# Patient Record
Sex: Female | Born: 1950 | Race: Black or African American | Hispanic: No | Marital: Single | Smoking: Never smoker
Health system: Southern US, Community
[De-identification: ages and names within clinical notes are randomized; demographics above are authoritative.]

## PROBLEM LIST (undated history)

## (undated) ENCOUNTER — Emergency Department (HOSPITAL_COMMUNITY): Payer: Medicare Other

## (undated) DIAGNOSIS — N200 Calculus of kidney: Secondary | ICD-10-CM

## (undated) DIAGNOSIS — M810 Age-related osteoporosis without current pathological fracture: Secondary | ICD-10-CM

## (undated) DIAGNOSIS — R928 Other abnormal and inconclusive findings on diagnostic imaging of breast: Secondary | ICD-10-CM

## (undated) DIAGNOSIS — K635 Polyp of colon: Secondary | ICD-10-CM

## (undated) DIAGNOSIS — G43909 Migraine, unspecified, not intractable, without status migrainosus: Secondary | ICD-10-CM

## (undated) DIAGNOSIS — M8580 Other specified disorders of bone density and structure, unspecified site: Secondary | ICD-10-CM

## (undated) DIAGNOSIS — R42 Dizziness and giddiness: Secondary | ICD-10-CM

## (undated) HISTORY — DX: Age-related osteoporosis without current pathological fracture: M81.0

## (undated) HISTORY — DX: Other specified disorders of bone density and structure, unspecified site: M85.80

## (undated) HISTORY — DX: Polyp of colon: K63.5

## (undated) HISTORY — DX: Migraine, unspecified, not intractable, without status migrainosus: G43.909

## (undated) HISTORY — DX: Other abnormal and inconclusive findings on diagnostic imaging of breast: R92.8

## (undated) HISTORY — DX: Dizziness and giddiness: R42

## (undated) HISTORY — DX: Calculus of kidney: N20.0

## (undated) HISTORY — PX: ABDOMINAL HYSTERECTOMY: SHX81

---

## 1999-03-31 ENCOUNTER — Ambulatory Visit (HOSPITAL_COMMUNITY): Admission: RE | Admit: 1999-03-31 | Discharge: 1999-03-31 | Payer: Self-pay | Admitting: Gastroenterology

## 2000-06-08 ENCOUNTER — Encounter: Payer: Self-pay | Admitting: Obstetrics and Gynecology

## 2000-06-08 ENCOUNTER — Encounter: Admission: RE | Admit: 2000-06-08 | Discharge: 2000-06-08 | Payer: Self-pay | Admitting: Obstetrics and Gynecology

## 2001-07-23 ENCOUNTER — Encounter: Admission: RE | Admit: 2001-07-23 | Discharge: 2001-07-23 | Payer: Self-pay | Admitting: Obstetrics and Gynecology

## 2001-07-23 ENCOUNTER — Encounter: Payer: Self-pay | Admitting: Obstetrics and Gynecology

## 2001-08-06 ENCOUNTER — Encounter: Admission: RE | Admit: 2001-08-06 | Discharge: 2001-08-06 | Payer: Self-pay | Admitting: Obstetrics and Gynecology

## 2001-08-06 ENCOUNTER — Encounter: Payer: Self-pay | Admitting: Obstetrics and Gynecology

## 2002-04-08 ENCOUNTER — Other Ambulatory Visit: Admission: RE | Admit: 2002-04-08 | Discharge: 2002-04-08 | Payer: Self-pay | Admitting: *Deleted

## 2002-08-13 ENCOUNTER — Encounter: Admission: RE | Admit: 2002-08-13 | Discharge: 2002-08-13 | Payer: Self-pay | Admitting: *Deleted

## 2002-08-13 ENCOUNTER — Encounter: Payer: Self-pay | Admitting: *Deleted

## 2004-11-08 ENCOUNTER — Emergency Department (HOSPITAL_COMMUNITY): Admission: EM | Admit: 2004-11-08 | Discharge: 2004-11-08 | Payer: Self-pay | Admitting: Emergency Medicine

## 2005-02-01 ENCOUNTER — Encounter: Admission: RE | Admit: 2005-02-01 | Discharge: 2005-02-01 | Payer: Self-pay | Admitting: *Deleted

## 2005-02-10 ENCOUNTER — Encounter: Admission: RE | Admit: 2005-02-10 | Discharge: 2005-02-10 | Payer: Self-pay | Admitting: *Deleted

## 2008-03-12 ENCOUNTER — Emergency Department (HOSPITAL_COMMUNITY): Admission: EM | Admit: 2008-03-12 | Discharge: 2008-03-12 | Payer: Self-pay | Admitting: Emergency Medicine

## 2011-03-23 ENCOUNTER — Other Ambulatory Visit: Payer: Self-pay | Admitting: Obstetrics and Gynecology

## 2011-03-23 DIAGNOSIS — R928 Other abnormal and inconclusive findings on diagnostic imaging of breast: Secondary | ICD-10-CM

## 2011-04-26 ENCOUNTER — Ambulatory Visit
Admission: RE | Admit: 2011-04-26 | Discharge: 2011-04-26 | Disposition: A | Discharge status: Home / Self Care | Payer: BC Managed Care – PPO | Source: Ambulatory Visit | Attending: Obstetrics and Gynecology | Admitting: Obstetrics and Gynecology

## 2011-04-26 ENCOUNTER — Other Ambulatory Visit: Payer: Self-pay | Admitting: Obstetrics and Gynecology

## 2011-04-26 DIAGNOSIS — R928 Other abnormal and inconclusive findings on diagnostic imaging of breast: Secondary | ICD-10-CM

## 2012-06-07 ENCOUNTER — Other Ambulatory Visit: Payer: Self-pay | Admitting: Obstetrics and Gynecology

## 2012-06-07 DIAGNOSIS — Z1231 Encounter for screening mammogram for malignant neoplasm of breast: Secondary | ICD-10-CM

## 2012-06-18 ENCOUNTER — Ambulatory Visit
Admission: RE | Admit: 2012-06-18 | Discharge: 2012-06-18 | Disposition: A | Discharge status: Home / Self Care | Payer: BC Managed Care – PPO | Source: Ambulatory Visit | Attending: Obstetrics and Gynecology | Admitting: Obstetrics and Gynecology

## 2012-06-18 DIAGNOSIS — Z1231 Encounter for screening mammogram for malignant neoplasm of breast: Secondary | ICD-10-CM

## 2012-07-02 ENCOUNTER — Encounter: Payer: Self-pay | Admitting: Obstetrics and Gynecology

## 2012-07-24 ENCOUNTER — Encounter: Payer: Self-pay | Admitting: Obstetrics and Gynecology

## 2012-07-24 ENCOUNTER — Ambulatory Visit (INDEPENDENT_AMBULATORY_CARE_PROVIDER_SITE_OTHER): Payer: BC Managed Care – PPO | Admitting: Obstetrics and Gynecology

## 2012-07-24 VITALS — BP 118/80 | Ht 65.0 in | Wt 188.0 lb

## 2012-07-24 DIAGNOSIS — M810 Age-related osteoporosis without current pathological fracture: Secondary | ICD-10-CM

## 2012-07-24 DIAGNOSIS — Z124 Encounter for screening for malignant neoplasm of cervix: Secondary | ICD-10-CM

## 2012-07-24 NOTE — Progress Notes (Signed)
Last Pap: 5/12 WNL: Yes Regular Periods:no Contraception: n/a  Monthly Breast exam:yes Tetanus<56yrs:yes Nl.Bladder Function:yes Daily BMs:yes Healthy Diet:yes Calcium:yes Mammogram:yes Date of Mammogram: 04/2012 Exercise:occ Have often Exercise: occ Seatbelt: yes Abuse at home: no Stressful work:no Sigmoid-colonoscopy: schd for 08/02/12 Bone Density: Yes 03/24/11 PCP: n/a Change in PMH: no chnage Change in FMH:no change BP 118/80  Ht 5\' 5"  (1.651 m)  Wt 188 lb (85.276 kg)  BMI 31.28 kg/m2 Physical Examination: Neck - supple, no significant adenopathy Chest - clear to auscultation, no wheezes, rales or rhonchi, symmetric air entry Heart - normal rate, regular rhythm, normal S1, S2, no murmurs, rubs, clicks or gallops Abdomen - soft, nontender, nondistended, no masses or organomegaly Breasts - breasts appear normal, no suspicious masses, no skin or nipple changes or axillary nodes Pelvic - normal external genitalia, vulva, vagina, cervix, , atrophic Rectal - normal rectal, no masses Musculoskeletal - no joint tenderness, deformity or swelling Extremities - peripheral pulses normal, no pedal edema, no clubbing or cyanosis Skin - normal coloration and turgor, no rashes, no suspicious skin lesions noted Normal AEX Osteoporosis.  Pt declined tx.  Repeat dexa next year Pt due for mammogram no colonoscopy due 08/02/12 Pap done yes can be the last if normal RT one year Diet and exercise discussed

## 2012-07-24 NOTE — Patient Instructions (Signed)
Osteoporosis  Osteoporosis is a disease of the bones that makes them weaker and prone to break (fracture). By their mid-30s, most people begin to gradually lose bone strength. If this is severe enough, osteoporosis may occur. Osteopenia is a less severe weakness of the bones, which places you at risk for osteoporosis. It is important to identify if you have osteoporosis or osteopenia. Bone fractures from osteoporosis (especially hip and spine fractures) are a major cause of hospitalization, loss of independence, and can lead to life-threatening complications.  CAUSES    There are a number of causes and risk factors:   Gender. Women are at a higher risk for osteoporosis than men.    Age. Bone formation slows down with age.    Ethnicity. For unclear reasons, white and Asian women are at higher risk for osteoporosis. Hispanic and African American women are at increased, but lesser, risk.    Family history of osteoporosis can mean that you are at a higher risk for getting it.    History of bone fractures indicates you may be at higher risk of another.    Calcium is very important for bone health and strength. Not enough calcium in your diet increases your risk for osteoporosis. Vitamin D is important for calcium metabolism. You get vitamin D from sunlight, foods, or supplements.    Physical activity. Bones get stronger with weight-bearing exercise and weaker without use.    Smoking is associated with decreased bone strength.    Medicines. Cortisone medicines, too much thyroid medicine, some cancer and seizure medicines, and others can weaken bones and cause osteoporosis.    Decreased body weight is associated with osteoporosis. The small amount of estrogen-type molecules produced in fat cells seems to protect the bones.    Menopausal decrease in the hormone estrogen can cause osteoporosis.    Low levels of the hormone testosterone can cause osteoporosis.     Some medical conditions can lead to osteoporosis (hyperthyroidism, hyperparathyroidism, B12 deficiency).   SYMPTOMS    Usually, no symptoms are felt as the bones weaken. The first symptoms are generally related to bone fractures. You may have silent, tiny bone fractures, especially in your spine. This can cause height loss and forward bending of the spine (kyphosis).  DIAGNOSIS    You or your caregiver may suspect osteoporosis based on height loss and kyphosis. Osteoporosis or osteopenia may be identified on an X-ray done for other reasons. A bone density measurement will likely be taken. Your bones are often measured at your lower spine or your hips. Measurement is done by an X-ray called a DEXA scan, or sometimes by a computerized X-ray scan (CT or CAT scan). Other tests may be done to find the cause of osteoporosis, such as blood tests to measure calcium and vitamin D, or to monitor treatment.  TREATMENT    The goal of osteoporosis treatment is to prevent fractures. This is done through medicine and home care treatments. Treatment will slow the weakening of your bones and strengthen them where possible. Measures to decrease the likelihood of falling and fracturing a bone are also important.  Medicine   You may need supplements if you are not getting enough calcium, vitamin D, and vitamin B12.    If you are female and menopausal, you should discuss the option of estrogen replacement or estrogen-like medicine with your caregiver.    Medicines can be taken by mouth or injection to help build bone strength. When taken by mouth, there are important directions   that you need to follow.    Calcitonin is a hormone made by the thyroid gland that can help build bone strength and decrease fracture risk in the spine. It can be taken by nasal spray or injection.    Parathyroid hormone can be injected to help build bone strength.    You will need to continue to get enough calcium intake with any of these medicines.    FALL PREVENTION   If you are unsteady on your feet, use a cane, walker, or walk with someone's help.    Remove loose rugs or electrical cords from your home.    Keep your home well lit at night. Use glasses if you need them.    Avoid icy streets and wet or waxed floors.    Hold the railing when using stairs.    Watch out for your pets.    Install grab bars in your bathroom.    Exercise. Physical activity, especially weight-bearing exercise, helps strengthen bones. Strength and balance exercise, such as tai chi, helps prevent falls.    Alcohol and some medicines can make you more likely to fall. Discuss alcohol use with your caregiver. Ask your caregiver if any of your medicines might increase your risk for falling. Ask if safer alternatives are available.   HOME CARE INSTRUCTIONS     Try to prevent and avoid falls.    To pick up objects, bend at the knees. Do not bend with your back.    Do not smoke. If you smoke, ask for help to stop.    Have adequate calcium and vitamin D in your diet. Talk with your caregiver about amounts.    Before exercising, ask your caregiver what exercises will be good for you.    Only take over-the-counter or prescription medicines for pain, discomfort, or fever as directed by your caregiver.   SEEK MEDICAL CARE IF:     You have had a fracture and your pain is not controlled.    You have had a fracture and you are not able to return to activities as expected.    You are reinjured.    You develop side effects from medicines, especially stomach pain or trouble swallowing.    You develop new, unexplained problems.   SEEK IMMEDIATE MEDICAL CARE IF:     You develop sudden, severe pain in your back.    You develop pain after an injury or fall.   Document Released: 08/03/2005 Document Revised: 10/13/2011 Document Reviewed: 10/08/2011  [employer] Patient Information 2012 [employer].

## 2012-07-25 LAB — PAP IG W/ RFLX HPV ASCU

## 2013-05-29 ENCOUNTER — Other Ambulatory Visit: Payer: Self-pay

## 2013-05-29 DIAGNOSIS — Z1231 Encounter for screening mammogram for malignant neoplasm of breast: Secondary | ICD-10-CM

## 2013-06-19 ENCOUNTER — Ambulatory Visit
Admission: RE | Admit: 2013-06-19 | Discharge: 2013-06-19 | Disposition: A | Discharge status: Home / Self Care | Payer: BC Managed Care – PPO | Source: Ambulatory Visit

## 2013-06-19 DIAGNOSIS — Z1231 Encounter for screening mammogram for malignant neoplasm of breast: Secondary | ICD-10-CM

## 2014-06-19 ENCOUNTER — Other Ambulatory Visit: Payer: Self-pay

## 2014-06-19 DIAGNOSIS — Z1231 Encounter for screening mammogram for malignant neoplasm of breast: Secondary | ICD-10-CM

## 2014-07-04 ENCOUNTER — Ambulatory Visit
Admission: RE | Admit: 2014-07-04 | Discharge: 2014-07-04 | Disposition: A | Discharge status: Home / Self Care | Payer: BC Managed Care – PPO | Source: Ambulatory Visit

## 2014-07-04 DIAGNOSIS — Z1231 Encounter for screening mammogram for malignant neoplasm of breast: Secondary | ICD-10-CM

## 2014-09-08 ENCOUNTER — Encounter: Payer: Self-pay | Admitting: Obstetrics and Gynecology

## 2015-06-24 ENCOUNTER — Other Ambulatory Visit: Payer: Self-pay

## 2015-06-24 DIAGNOSIS — Z1231 Encounter for screening mammogram for malignant neoplasm of breast: Secondary | ICD-10-CM

## 2015-08-04 ENCOUNTER — Ambulatory Visit
Admission: RE | Admit: 2015-08-04 | Discharge: 2015-08-04 | Disposition: A | Discharge status: Home / Self Care | Payer: Managed Care, Other (non HMO) | Source: Ambulatory Visit

## 2015-08-04 DIAGNOSIS — Z1231 Encounter for screening mammogram for malignant neoplasm of breast: Secondary | ICD-10-CM

## 2016-02-03 ENCOUNTER — Other Ambulatory Visit (INDEPENDENT_AMBULATORY_CARE_PROVIDER_SITE_OTHER): Payer: Managed Care, Other (non HMO)

## 2016-02-03 ENCOUNTER — Encounter: Payer: Self-pay | Admitting: Internal Medicine

## 2016-02-03 ENCOUNTER — Ambulatory Visit (INDEPENDENT_AMBULATORY_CARE_PROVIDER_SITE_OTHER): Payer: Managed Care, Other (non HMO) | Admitting: Internal Medicine

## 2016-02-03 VITALS — BP 180/100 | HR 54 | Temp 97.9°F | Resp 12 | Ht 65.5 in | Wt 184.0 lb

## 2016-02-03 DIAGNOSIS — R030 Elevated blood-pressure reading, without diagnosis of hypertension: Secondary | ICD-10-CM | POA: Diagnosis not present

## 2016-02-03 DIAGNOSIS — I10 Essential (primary) hypertension: Secondary | ICD-10-CM | POA: Insufficient documentation

## 2016-02-03 LAB — LIPID PANEL
Cholesterol: 195 mg/dL (ref 0–200)
HDL: 78.6 mg/dL (ref >39.00)
LDL Cholesterol: 100 mg/dL — ABNORMAL HIGH (ref 0–99)
NonHDL: 116.09
Total CHOL/HDL Ratio: 2
Triglycerides: 80 mg/dL (ref 0.0–149.0)
VLDL: 16 mg/dL (ref 0.0–40.0)

## 2016-02-03 LAB — CBC
HCT: 40.1 % (ref 36.0–46.0)
Hemoglobin: 13.3 g/dL (ref 12.0–15.0)
MCHC: 33.2 g/dL (ref 30.0–36.0)
MCV: 87.2 fl (ref 78.0–100.0)
Platelets: 176 10*3/uL (ref 150.0–400.0)
RBC: 4.61 Mil/uL (ref 3.87–5.11)
RDW: 17.3 % — ABNORMAL HIGH (ref 11.5–15.5)
WBC: 5.2 10*3/uL (ref 4.0–10.5)

## 2016-02-03 LAB — COMPREHENSIVE METABOLIC PANEL
ALT: 11 U/L (ref 0–35)
AST: 12 U/L (ref 0–37)
Albumin: 4 g/dL (ref 3.5–5.2)
Alkaline Phosphatase: 71 U/L (ref 39–117)
BUN: 13 mg/dL (ref 6–23)
CO2: 32 mEq/L (ref 19–32)
Calcium: 9.3 mg/dL (ref 8.4–10.5)
Chloride: 105 mEq/L (ref 96–112)
Creatinine, Ser: 0.89 mg/dL (ref 0.40–1.20)
GFR: 81.56 mL/min (ref >60.00)
Glucose, Bld: 81 mg/dL (ref 70–99)
Potassium: 4.1 mEq/L (ref 3.5–5.1)
Sodium: 141 mEq/L (ref 135–145)
Total Bilirubin: 0.4 mg/dL (ref 0.2–1.2)
Total Protein: 7.2 g/dL (ref 6.0–8.3)

## 2016-02-03 LAB — HEPATITIS C ANTIBODY: HCV Ab: NEGATIVE

## 2016-02-03 LAB — HEMOGLOBIN A1C: Hgb A1c MFr Bld: 5.7 % (ref 4.6–6.5)

## 2016-02-03 NOTE — Patient Instructions (Addendum)
We have checked the EKG which looks at the heart and yours is normal.  We would like to see you back in about 3 months to make sure that the blood pressure is normal.   We are checking labs on you today to check to make sure everything is normal. We will call you back about the labs even if the labs are totally normal.   Health Maintenance, Female Adopting a healthy lifestyle and getting preventive care can go a long way to promote health and wellness. Talk with your health care provider about what schedule of regular examinations is right for you. This is a good chance for you to check in with your provider about disease prevention and staying healthy. In between checkups, there are plenty of things you can do on your own. Experts have done a lot of research about which lifestyle changes and preventive measures are most likely to keep you healthy. Ask your health care provider for more information. WEIGHT AND DIET  Eat a healthy diet  Be sure to include plenty of vegetables, fruits, low-fat dairy products, and lean protein.  Do not eat a lot of foods high in solid fats, added sugars, or salt.  Get regular exercise. This is one of the most important things you can do for your health.  Most adults should exercise for at least 150 minutes each week. The exercise should increase your heart rate and make you sweat (moderate-intensity exercise).  Most adults should also do strengthening exercises at least twice a week. This is in addition to the moderate-intensity exercise.  Maintain a healthy weight  Body mass index (BMI) is a measurement that can be used to identify possible weight problems. It estimates body fat based on height and weight. Your health care provider can help determine your BMI and help you achieve or maintain a healthy weight.  For females 97 years of age and older:   A BMI below 18.5 is considered underweight.  A BMI of 18.5 to 24.9 is normal.  A BMI of 25 to 29.9 is  considered overweight.  A BMI of 30 and above is considered obese.  Watch levels of cholesterol and blood lipids  You should start having your blood tested for lipids and cholesterol at 66 years of age, then have this test every 5 years.  You may need to have your cholesterol levels checked more often if:  Your lipid or cholesterol levels are high.  You are older than 66 years of age.  You are at high risk for heart disease.  CANCER SCREENING   Lung Cancer  Lung cancer screening is recommended for adults 34-27 years old who are at high risk for lung cancer because of a history of smoking.  A yearly low-dose CT scan of the lungs is recommended for people who:  Currently smoke.  Have quit within the past 15 years.  Have at least a 30-pack-year history of smoking. A pack year is smoking an average of one pack of cigarettes a day for 1 year.  Yearly screening should continue until it has been 15 years since you quit.  Yearly screening should stop if you develop a health problem that would prevent you from having lung cancer treatment.  Breast Cancer  Practice breast self-awareness. This means understanding how your breasts normally appear and feel.  It also means doing regular breast self-exams. Let your health care provider know about any changes, no matter how small.  If you are in your  20s or 25s, you should have a clinical breast exam (CBE) by a health care provider every 1-3 years as part of a regular health exam.  If you are 50 or older, have a CBE every year. Also consider having a breast X-ray (mammogram) every year.  If you have a family history of breast cancer, talk to your health care provider about genetic screening.  If you are at high risk for breast cancer, talk to your health care provider about having an MRI and a mammogram every year.  Breast cancer gene (BRCA) assessment is recommended for women who have family members with BRCA-related cancers.  BRCA-related cancers include:  Breast.  Ovarian.  Tubal.  Peritoneal cancers.  Results of the assessment will determine the need for genetic counseling and BRCA1 and BRCA2 testing. Cervical Cancer Your health care provider may recommend that you be screened regularly for cancer of the pelvic organs (ovaries, uterus, and vagina). This screening involves a pelvic examination, including checking for microscopic changes to the surface of your cervix (Pap test). You may be encouraged to have this screening done every 3 years, beginning at age 45.  For women ages 42-65, health care providers may recommend pelvic exams and Pap testing every 3 years, or they may recommend the Pap and pelvic exam, combined with testing for human papilloma virus (HPV), every 5 years. Some types of HPV increase your risk of cervical cancer. Testing for HPV may also be done on women of any age with unclear Pap test results.  Other health care providers may not recommend any screening for nonpregnant women who are considered low risk for pelvic cancer and who do not have symptoms. Ask your health care provider if a screening pelvic exam is right for you.  If you have had past treatment for cervical cancer or a condition that could lead to cancer, you need Pap tests and screening for cancer for at least 20 years after your treatment. If Pap tests have been discontinued, your risk factors (such as having a new sexual partner) need to be reassessed to determine if screening should resume. Some women have medical problems that increase the chance of getting cervical cancer. In these cases, your health care provider may recommend more frequent screening and Pap tests. Colorectal Cancer  This type of cancer can be detected and often prevented.  Routine colorectal cancer screening usually begins at 66 years of age and continues through 66 years of age.  Your health care provider may recommend screening at an earlier age if you  have risk factors for colon cancer.  Your health care provider may also recommend using home test kits to check for hidden blood in the stool.  A small camera at the end of a tube can be used to examine your colon directly (sigmoidoscopy or colonoscopy). This is done to check for the earliest forms of colorectal cancer.  Routine screening usually begins at age 5.  Direct examination of the colon should be repeated every 5-10 years through 66 years of age. However, you may need to be screened more often if early forms of precancerous polyps or small growths are found. Skin Cancer  Check your skin from head to toe regularly.  Tell your health care provider about any new moles or changes in moles, especially if there is a change in a mole's shape or color.  Also tell your health care provider if you have a mole that is larger than the size of a pencil eraser.  Always use sunscreen. Apply sunscreen liberally and repeatedly throughout the day.  Protect yourself by wearing long sleeves, pants, a wide-brimmed hat, and sunglasses whenever you are outside. HEART DISEASE, DIABETES, AND HIGH BLOOD PRESSURE   High blood pressure causes heart disease and increases the risk of stroke. High blood pressure is more likely to develop in:  People who have blood pressure in the high end of the normal range (130-139/85-89 mm Hg).  People who are overweight or obese.  People who are African American.  If you are 13-40 years of age, have your blood pressure checked every 3-5 years. If you are 37 years of age or older, have your blood pressure checked every year. You should have your blood pressure measured twice--once when you are at a hospital or clinic, and once when you are not at a hospital or clinic. Record the average of the two measurements. To check your blood pressure when you are not at a hospital or clinic, you can use:  An automated blood pressure machine at a pharmacy.  A home blood pressure  monitor.  If you are between 39 years and 80 years old, ask your health care provider if you should take aspirin to prevent strokes.  Have regular diabetes screenings. This involves taking a blood sample to check your fasting blood sugar level.  If you are at a normal weight and have a low risk for diabetes, have this test once every three years after 66 years of age.  If you are overweight and have a high risk for diabetes, consider being tested at a younger age or more often. PREVENTING INFECTION  Hepatitis B  If you have a higher risk for hepatitis B, you should be screened for this virus. You are considered at high risk for hepatitis B if:  You were born in a country where hepatitis B is common. Ask your health care provider which countries are considered high risk.  Your parents were born in a high-risk country, and you have not been immunized against hepatitis B (hepatitis B vaccine).  You have HIV or AIDS.  You use needles to inject street drugs.  You live with someone who has hepatitis B.  You have had sex with someone who has hepatitis B.  You get hemodialysis treatment.  You take certain medicines for conditions, including cancer, organ transplantation, and autoimmune conditions. Hepatitis C  Blood testing is recommended for:  Everyone born from [birth year] through 1966.  Anyone with known risk factors for hepatitis C. Sexually transmitted infections (STIs)  You should be screened for sexually transmitted infections (STIs) including gonorrhea and chlamydia if:  You are sexually active and are younger than 66 years of age.  You are older than 66 years of age and your health care provider tells you that you are at risk for this type of infection.  Your sexual activity has changed since you were last screened and you are at an increased risk for chlamydia or gonorrhea. Ask your health care provider if you are at risk.  If you do not have HIV, but are at risk, it may be  recommended that you take a prescription medicine daily to prevent HIV infection. This is called pre-exposure prophylaxis (PrEP). You are considered at risk if:  You are sexually active and do not regularly use condoms or know the HIV status of your partner(s).  You take drugs by injection.  You are sexually active with a partner who has HIV. Talk with your health  care provider about whether you are at high risk of being infected with HIV. If you choose to begin PrEP, you should first be tested for HIV. You should then be tested every 3 months for as long as you are taking PrEP.  PREGNANCY   If you are premenopausal and you may become pregnant, ask your health care provider about preconception counseling.  If you may become pregnant, take 400 to 800 micrograms (mcg) of folic acid every day.  If you want to prevent pregnancy, talk to your health care provider about birth control (contraception). OSTEOPOROSIS AND MENOPAUSE   Osteoporosis is a disease in which the bones lose minerals and strength with aging. This can result in serious bone fractures. Your risk for osteoporosis can be identified using a bone density scan.  If you are 34 years of age or older, or if you are at risk for osteoporosis and fractures, ask your health care provider if you should be screened.  Ask your health care provider whether you should take a calcium or vitamin D supplement to lower your risk for osteoporosis.  Menopause may have certain physical symptoms and risks.  Hormone replacement therapy may reduce some of these symptoms and risks. Talk to your health care provider about whether hormone replacement therapy is right for you.  HOME CARE INSTRUCTIONS   Schedule regular health, dental, and eye exams.  Stay current with your immunizations.   Do not use any tobacco products including cigarettes, chewing tobacco, or electronic cigarettes.  If you are pregnant, do not drink alcohol.  If you are  breastfeeding, limit how much and how often you drink alcohol.  Limit alcohol intake to no more than 1 drink per day for nonpregnant women. One drink equals 12 ounces of beer, 5 ounces of wine, or 1 ounces of hard liquor.  Do not use street drugs.  Do not share needles.  Ask your health care provider for help if you need support or information about quitting drugs.  Tell your health care provider if you often feel depressed.  Tell your health care provider if you have ever been abused or do not feel safe at home.   This information is not intended to replace advice given to you by your health care provider. Make sure you discuss any questions you have with your health care provider.   Document Released: 05/09/2011 Document Revised: 11/14/2014 Document Reviewed: 09/25/2013 Elsevier Interactive Patient Education [employer].

## 2016-02-03 NOTE — Assessment & Plan Note (Signed)
EKG fine, checking CMP, lipid panel. Advised she should come back in 3 months for BP check. If still elevated needs medication. She denies symptoms today. States BP not high before however has had limited health care.

## 2016-02-03 NOTE — Progress Notes (Signed)
Pre visit review using our clinic review tool, if applicable. No additional management support is needed unless otherwise documented below in the visit note. 

## 2016-02-03 NOTE — Progress Notes (Signed)
   Subjective:    Patient ID: Kaylee Cardenas, female    DOB: [birth year], 65 y.o.   MRN: [id number]  HPI The patient is a 65 YO female coming in new for her blood pressure. She has not had high blood pressure before to her knowledge but she has been feeling poorly. Some blurred vision in the morning. Mild headaches which are not unusual for her. She has had accident several years ago and started having migraines afterwards. Denies chest pains or dizziness. No fevers or chills. Taking some occasional ibuprofen for arthritis pain.   PMH, [facility], social history reviewed and updated.   Review of Systems  Constitutional: Negative for fever, activity change, appetite change, fatigue and unexpected weight change.  Eyes: Negative.   Respiratory: Negative for cough, chest tightness, shortness of breath and wheezing.   Cardiovascular: Negative for chest pain, palpitations and leg swelling.  Gastrointestinal: Negative for nausea, abdominal pain, diarrhea, constipation and abdominal distention.  Musculoskeletal: Positive for arthralgias.  Skin: Negative.   Neurological: Positive for headaches. Negative for dizziness, seizures, weakness and numbness.  Psychiatric/Behavioral: Negative.       Objective:   Physical Exam  Constitutional: She is oriented to person, place, and time. She appears well-developed and well-nourished.  HENT:  Head: Normocephalic and atraumatic.  Eyes: EOM are normal.  Neck: Normal range of motion.  Cardiovascular: Normal rate and regular rhythm.   No murmur heard. Pulmonary/Chest: Effort normal and breath sounds normal. No respiratory distress. She has no wheezes.  Abdominal: Soft. Bowel sounds are normal. She exhibits no distension. There is no tenderness. There is no rebound.  Neurological: She is alert and oriented to person, place, and time. Coordination normal.  Skin: Skin is warm and dry.  Psychiatric: She has a normal mood and affect.   Filed Vitals:   02/03/16 0857    BP: 208/92  Pulse: 54  Temp: 97.9 F (36.6 C)  TempSrc: Oral  Resp: 12  Height: 5' 5.5" (1.664 m)  Weight: 184 lb (83.462 kg)  SpO2: 98%   EKG: Rate 60, axis okay, intervals normal, some repolarization variant on V2-V3, no ST or t wave changes, no prior to compare. Sinus.     Assessment & Plan:

## 2016-05-05 ENCOUNTER — Ambulatory Visit: Payer: Managed Care, Other (non HMO) | Admitting: Internal Medicine

## 2016-05-13 ENCOUNTER — Encounter: Payer: Self-pay | Admitting: Internal Medicine

## 2016-05-13 ENCOUNTER — Ambulatory Visit (INDEPENDENT_AMBULATORY_CARE_PROVIDER_SITE_OTHER): Payer: Managed Care, Other (non HMO) | Admitting: Internal Medicine

## 2016-05-13 VITALS — BP 164/78 | HR 58 | Temp 97.6°F | Resp 14 | Ht 65.5 in | Wt 191.0 lb

## 2016-05-13 DIAGNOSIS — R030 Elevated blood-pressure reading, without diagnosis of hypertension: Secondary | ICD-10-CM | POA: Diagnosis not present

## 2016-05-13 MED ORDER — AMLODIPINE BESYLATE 10 MG PO TABS: 10.0000 mg | ORAL_TABLET | Freq: Every day | ORAL | Status: DC

## 2016-05-13 NOTE — Assessment & Plan Note (Signed)
Needs BP meds and started amlodipine 10 mg daily. Talked her into trying due to the high pressures ongoing. Reminded her that if she had heart attack or stroke she would be on more than 1 medicine likely.

## 2016-05-13 NOTE — Patient Instructions (Signed)
We have sent in a blood pressure medicine called amlodipine that you take 1 pill daily.   Work on exercising at least 4-5 times per week to help regulate the blood pressure and also low sodium or salt in the diet.   DASH Eating Plan DASH stands for "Dietary Approaches to Stop Hypertension." The DASH eating plan is a healthy eating plan that has been shown to reduce high blood pressure (hypertension). Additional health benefits may include reducing the risk of type 2 diabetes mellitus, heart disease, and stroke. The DASH eating plan may also help with weight loss. WHAT DO I NEED TO KNOW ABOUT THE DASH EATING PLAN? For the DASH eating plan, you will follow these general guidelines:  Choose foods with a percent daily value for sodium of less than 5% (as listed on the food label).  Use salt-free seasonings or herbs instead of table salt or sea salt.  Check with your health care provider or pharmacist before using salt substitutes.  Eat lower-sodium products, often labeled as "lower sodium" or "no salt added."  Eat fresh foods.  Eat more vegetables, fruits, and low-fat dairy products.  Choose whole grains. Look for the word "whole" as the first word in the ingredient list.  Choose fish and skinless chicken or [place] more often than red meat. Limit fish, poultry, and meat to 6 oz (170 g) each day.  Limit sweets, desserts, sugars, and sugary drinks.  Choose heart-healthy fats.  Limit cheese to 1 oz (28 g) per day.  Eat more home-cooked food and less restaurant, buffet, and fast food.  Limit fried foods.  Cook foods using methods other than frying.  Limit canned vegetables. If you do use them, rinse them well to decrease the sodium.  When eating at a restaurant, ask that your food be prepared with less salt, or no salt if possible. WHAT FOODS CAN I EAT? Seek help from a dietitian for individual calorie needs. Grains Whole grain or whole wheat bread. Brown rice. Whole grain or whole  wheat pasta. Quinoa, bulgur, and whole grain cereals. Low-sodium cereals. Corn or whole wheat flour tortillas. Whole grain cornbread. Whole grain crackers. Low-sodium crackers. Vegetables Fresh or frozen vegetables (raw, steamed, roasted, or grilled). Low-sodium or reduced-sodium tomato and vegetable juices. Low-sodium or reduced-sodium tomato sauce and paste. Low-sodium or reduced-sodium canned vegetables.  Fruits All fresh, canned (in natural juice), or frozen fruits. Meat and Other Protein Products Ground beef (85% or leaner), grass-fed beef, or beef trimmed of fat. Skinless chicken or [place]. Ground chicken or [place]. Pork trimmed of fat. All fish and seafood. Eggs. Dried beans, peas, or lentils. Unsalted nuts and seeds. Unsalted canned beans. Dairy Low-fat dairy products, such as skim or 1% milk, 2% or reduced-fat cheeses, low-fat ricotta or cottage cheese, or plain low-fat yogurt. Low-sodium or reduced-sodium cheeses. Fats and Oils Tub margarines without trans fats. Light or reduced-fat mayonnaise and salad dressings (reduced sodium). Avocado. Safflower, olive, or canola oils. Natural peanut or almond butter. Other Unsalted popcorn and pretzels. The items listed above may not be a complete list of recommended foods or beverages. Contact your dietitian for more options. WHAT FOODS ARE NOT RECOMMENDED? Grains White bread. White pasta. White rice. Refined cornbread. Bagels and croissants. Crackers that contain trans fat. Vegetables Creamed or fried vegetables. Vegetables in a cheese sauce. Regular canned vegetables. Regular canned tomato sauce and paste. Regular tomato and vegetable juices. Fruits Dried fruits. Canned fruit in light or heavy syrup. Fruit juice. Meat and Other Protein  Products Fatty cuts of meat. Ribs, chicken wings, bacon, sausage, bologna, salami, chitterlings, fatback, hot dogs, bratwurst, and packaged luncheon meats. Salted nuts and seeds. Canned beans with  salt. Dairy Whole or 2% milk, cream, half-and-half, and cream cheese. Whole-fat or sweetened yogurt. Full-fat cheeses or blue cheese. Nondairy creamers and whipped toppings. Processed cheese, cheese spreads, or cheese curds. Condiments Onion and garlic salt, seasoned salt, table salt, and sea salt. Canned and packaged gravies. Worcestershire sauce. Tartar sauce. Barbecue sauce. Teriyaki sauce. Soy sauce, including reduced sodium. Steak sauce. Fish sauce. Oyster sauce. Cocktail sauce. Horseradish. Ketchup and mustard. Meat flavorings and tenderizers. Bouillon cubes. Hot sauce. Tabasco sauce. Marinades. Taco seasonings. Relishes. Fats and Oils Butter, stick margarine, lard, shortening, ghee, and bacon fat. Coconut, palm kernel, or palm oils. Regular salad dressings. Other Pickles and olives. Salted popcorn and pretzels. The items listed above may not be a complete list of foods and beverages to avoid. Contact your dietitian for more information. WHERE CAN I FIND MORE INFORMATION? National Heart, Lung, and Blood Institute: travelstabloid.com   This information is not intended to replace advice given to you by your health care provider. Make sure you discuss any questions you have with your health care provider.   Document Released: 10/13/2011 Document Revised: 11/14/2014 Document Reviewed: 08/28/2013 Elsevier Interactive Patient Education [employer].

## 2016-05-13 NOTE — Progress Notes (Signed)
Pre visit review using our clinic review tool, if applicable. No additional management support is needed unless otherwise documented below in the visit note. 

## 2016-05-13 NOTE — Progress Notes (Signed)
   Subjective:    Patient ID: Kaylee Cardenas, female    DOB: 11/14/1950, 65 y.o.   MRN: [id number]  HPI The patient is a 65 YO female coming in for follow up of her blood pressure. Still running high since last visit. Had an episode of some dizziness last week and had BP checked and it was 180/100. No headaches or chest pains. No SOB or abdominal symptoms. She does not want to start BP meds if at all possible.   Review of Systems  Constitutional: Negative for fever, activity change, appetite change, fatigue and unexpected weight change.  Eyes: Negative.   Respiratory: Negative for cough, chest tightness, shortness of breath and wheezing.   Cardiovascular: Negative for chest pain, palpitations and leg swelling.  Gastrointestinal: Negative for nausea, abdominal pain, diarrhea, constipation and abdominal distention.  Musculoskeletal: Positive for arthralgias.  Skin: Negative.   Neurological: Positive for light-headedness. Negative for dizziness, seizures, weakness, numbness and headaches.  Psychiatric/Behavioral: Negative.       Objective:   Physical Exam  Constitutional: She is oriented to person, place, and time. She appears well-developed and well-nourished.  HENT:  Head: Normocephalic and atraumatic.  Eyes: EOM are normal.  Neck: Normal range of motion.  Cardiovascular: Normal rate and regular rhythm.   No murmur heard. Pulmonary/Chest: Effort normal and breath sounds normal. No respiratory distress. She has no wheezes.  Abdominal: Soft. She exhibits no distension. There is no tenderness. There is no rebound.  Neurological: She is alert and oriented to person, place, and time. Coordination normal.  Skin: Skin is warm and dry.   Filed Vitals:   05/13/16 1333  BP: 164/78  Pulse: 58  Temp: 97.6 F (36.4 C)  TempSrc: Oral  Resp: 14  Height: 5' 5.5" (1.664 m)  Weight: 191 lb (86.637 kg)  SpO2: 98%      Assessment & Plan:

## 2016-07-07 ENCOUNTER — Other Ambulatory Visit: Payer: Self-pay | Admitting: Internal Medicine

## 2016-07-07 DIAGNOSIS — Z1231 Encounter for screening mammogram for malignant neoplasm of breast: Secondary | ICD-10-CM

## 2016-08-05 ENCOUNTER — Ambulatory Visit
Admission: RE | Admit: 2016-08-05 | Discharge: 2016-08-05 | Disposition: A | Discharge status: Home / Self Care | Payer: Self-pay | Source: Ambulatory Visit | Attending: Internal Medicine | Admitting: Internal Medicine

## 2016-08-05 DIAGNOSIS — Z1231 Encounter for screening mammogram for malignant neoplasm of breast: Secondary | ICD-10-CM

## 2016-08-10 ENCOUNTER — Other Ambulatory Visit: Payer: Self-pay | Admitting: Internal Medicine

## 2016-08-10 DIAGNOSIS — R928 Other abnormal and inconclusive findings on diagnostic imaging of breast: Secondary | ICD-10-CM

## 2016-08-15 ENCOUNTER — Other Ambulatory Visit: Payer: Self-pay

## 2016-08-15 ENCOUNTER — Other Ambulatory Visit: Payer: Self-pay | Admitting: Internal Medicine

## 2016-08-15 DIAGNOSIS — R928 Other abnormal and inconclusive findings on diagnostic imaging of breast: Secondary | ICD-10-CM

## 2016-08-16 ENCOUNTER — Ambulatory Visit
Admission: RE | Admit: 2016-08-16 | Discharge: 2016-08-16 | Disposition: A | Discharge status: Home / Self Care | Payer: Managed Care, Other (non HMO) | Source: Ambulatory Visit | Attending: Internal Medicine | Admitting: Internal Medicine

## 2016-08-16 DIAGNOSIS — R928 Other abnormal and inconclusive findings on diagnostic imaging of breast: Secondary | ICD-10-CM

## 2016-09-07 ENCOUNTER — Ambulatory Visit (INDEPENDENT_AMBULATORY_CARE_PROVIDER_SITE_OTHER): Payer: Managed Care, Other (non HMO) | Admitting: Internal Medicine

## 2016-09-07 ENCOUNTER — Encounter: Payer: Self-pay | Admitting: Internal Medicine

## 2016-09-07 VITALS — BP 132/70 | HR 58 | Temp 97.9°F | Resp 14 | Ht 65.5 in | Wt 190.0 lb

## 2016-09-07 DIAGNOSIS — I10 Essential (primary) hypertension: Secondary | ICD-10-CM

## 2016-09-07 DIAGNOSIS — Z23 Encounter for immunization: Secondary | ICD-10-CM

## 2016-09-07 MED ORDER — AMLODIPINE BESYLATE 5 MG PO TABS
5.0000 mg | ORAL_TABLET | Freq: Every day | ORAL | 3 refills | Status: DC
Start: 2016-09-07 — End: 2017-09-15

## 2016-09-07 NOTE — Assessment & Plan Note (Signed)
Will reduce amlodipine to 5 mg daily and see if she is still at goal. She will monitor at home. BP at goal today and with evening dosing no side effects.

## 2016-09-07 NOTE — Progress Notes (Signed)
   Subjective:    Patient ID: Kaylee Cardenas, female    DOB: 05/25/1951, 65 y.o.   MRN: [id number]  HPI The patient is a 65 YO female coming in for blood pressure recheck. She has started taking the amlodipine and got some leg swelling. She then started taking it at night time and now is not having swelling. She is wondering if we can cut down the milligrams to see if she still does well with that. No chest pains or SOB or abdominal pain or nausea.   Review of Systems  Constitutional: Negative.   Respiratory: Negative.   Cardiovascular: Negative.   Gastrointestinal: Negative.   Musculoskeletal: Negative.   Neurological: Negative.       Objective:   Physical Exam  Constitutional: She is oriented to person, place, and time. She appears well-developed and well-nourished.  HENT:  Head: Normocephalic and atraumatic.  Eyes: EOM are normal.  Neck: Normal range of motion.  Cardiovascular: Normal rate and regular rhythm.   Pulmonary/Chest: Effort normal and breath sounds normal. No respiratory distress. She has no wheezes. She has no rales.  Abdominal: Soft. She exhibits no distension. There is no tenderness. There is no rebound.  Musculoskeletal: She exhibits no edema.  Neurological: She is alert and oriented to person, place, and time.  Skin: Skin is warm and dry.   Vitals:   09/07/16 0821  BP: 132/70  Pulse: (!) 58  Resp: 14  Temp: 97.9 F (36.6 C)  TempSrc: Oral  SpO2: 98%  Weight: 190 lb (86.2 kg)  Height: 5' 5.5" (1.664 m)      Assessment & Plan:  Prevnar 13 given at visit.

## 2016-09-07 NOTE — Patient Instructions (Addendum)
We will decrease the amlodipine to 5 mg daily.   Check the blood pressure some and call us if it is staying above 150 for the top number and over 90 give us a call.

## 2016-09-07 NOTE — Progress Notes (Signed)
Pre visit review using our clinic review tool, if applicable. No additional management support is needed unless otherwise documented below in the visit note. 

## 2016-09-08 ENCOUNTER — Encounter: Payer: Self-pay | Admitting: Internal Medicine

## 2017-04-14 ENCOUNTER — Telehealth: Payer: Self-pay | Admitting: Internal Medicine

## 2017-04-14 ENCOUNTER — Encounter: Payer: Managed Care, Other (non HMO) | Admitting: Internal Medicine

## 2017-04-14 NOTE — Telephone Encounter (Signed)
yes

## 2017-04-14 NOTE — Telephone Encounter (Signed)
Pt returned our call about canceling her CPE appointment with Dr Okey Duprerawford for this morning. Would you be willing to do her physical while Dr Okey Duprerawford is out?

## 2017-04-27 ENCOUNTER — Encounter: Payer: Self-pay | Admitting: Internal Medicine

## 2017-04-27 ENCOUNTER — Ambulatory Visit (INDEPENDENT_AMBULATORY_CARE_PROVIDER_SITE_OTHER): Payer: Managed Care, Other (non HMO) | Admitting: Internal Medicine

## 2017-04-27 ENCOUNTER — Other Ambulatory Visit (INDEPENDENT_AMBULATORY_CARE_PROVIDER_SITE_OTHER): Payer: Managed Care, Other (non HMO)

## 2017-04-27 VITALS — BP 140/84 | HR 58 | Temp 98.7°F | Resp 16 | Ht 65.5 in | Wt 195.0 lb

## 2017-04-27 DIAGNOSIS — Z0000 Encounter for general adult medical examination without abnormal findings: Secondary | ICD-10-CM

## 2017-04-27 DIAGNOSIS — M8580 Other specified disorders of bone density and structure, unspecified site: Secondary | ICD-10-CM | POA: Insufficient documentation

## 2017-04-27 DIAGNOSIS — I10 Essential (primary) hypertension: Secondary | ICD-10-CM | POA: Diagnosis not present

## 2017-04-27 DIAGNOSIS — R7303 Prediabetes: Secondary | ICD-10-CM | POA: Insufficient documentation

## 2017-04-27 LAB — COMPREHENSIVE METABOLIC PANEL
ALT: 11 U/L (ref 0–35)
AST: 12 U/L (ref 0–37)
Albumin: 4 g/dL (ref 3.5–5.2)
Alkaline Phosphatase: 79 U/L (ref 39–117)
BUN: 11 mg/dL (ref 6–23)
CO2: 30 mEq/L (ref 19–32)
Calcium: 9.3 mg/dL (ref 8.4–10.5)
Chloride: 105 mEq/L (ref 96–112)
Creatinine, Ser: 0.94 mg/dL (ref 0.40–1.20)
GFR: 76.29 mL/min (ref >60.00)
Glucose, Bld: 97 mg/dL (ref 70–99)
Potassium: 3.8 mEq/L (ref 3.5–5.1)
Sodium: 141 mEq/L (ref 135–145)
Total Bilirubin: 0.3 mg/dL (ref 0.2–1.2)
Total Protein: 7.1 g/dL (ref 6.0–8.3)

## 2017-04-27 LAB — CBC WITH DIFFERENTIAL/PLATELET
Basophils Absolute: 0.1 10*3/uL (ref 0.0–0.1)
Basophils Relative: 0.9 % (ref 0.0–3.0)
Eosinophils Absolute: 0.1 10*3/uL (ref 0.0–0.7)
Eosinophils Relative: 2.1 % (ref 0.0–5.0)
HCT: 40.9 % (ref 36.0–46.0)
Hemoglobin: 13.5 g/dL (ref 12.0–15.0)
Lymphocytes Relative: 49.4 % — ABNORMAL HIGH (ref 12.0–46.0)
Lymphs Abs: 2.7 10*3/uL (ref 0.7–4.0)
MCHC: 32.9 g/dL (ref 30.0–36.0)
MCV: 87.8 fl (ref 78.0–100.0)
Monocytes Absolute: 0.5 10*3/uL (ref 0.1–1.0)
Monocytes Relative: 8.4 % (ref 3.0–12.0)
Neutro Abs: 2.2 10*3/uL (ref 1.4–7.7)
Neutrophils Relative %: 39.2 % — ABNORMAL LOW (ref 43.0–77.0)
Platelets: 202 10*3/uL (ref 150.0–400.0)
RBC: 4.66 Mil/uL (ref 3.87–5.11)
RDW: 14.8 % (ref 11.5–15.5)
WBC: 5.5 10*3/uL (ref 4.0–10.5)

## 2017-04-27 LAB — LIPID PANEL
Cholesterol: 184 mg/dL (ref 0–200)
HDL: 74 mg/dL (ref >39.00)
LDL Cholesterol: 95 mg/dL (ref 0–99)
NonHDL: 110.01
Total CHOL/HDL Ratio: 2
Triglycerides: 73 mg/dL (ref 0.0–149.0)
VLDL: 14.6 mg/dL (ref 0.0–40.0)

## 2017-04-27 LAB — TSH: TSH: 1.34 u[IU]/mL (ref 0.35–4.50)

## 2017-04-27 LAB — HEMOGLOBIN A1C: Hgb A1c MFr Bld: 6 % (ref 4.6–6.5)

## 2017-04-27 NOTE — Patient Instructions (Addendum)
Test(s) ordered today. Your results will be released to [place] (or called to you) after review, usually within 72hours after test completion. If any changes need to be made, you will be notified at that same time.  All other Health Maintenance issues reviewed.   All recommended immunizations and age-appropriate screenings are up-to-date or discussed.  No immunizations administered today.   Medications reviewed and updated.   No changes recommended at this time.    Please followup in annually for a physical exam   Health Maintenance, Female Adopting a healthy lifestyle and getting preventive care can go a long way to promote health and wellness. Talk with your health care provider about what schedule of regular examinations is right for you. This is a good chance for you to check in with your provider about disease prevention and staying healthy. In between checkups, there are plenty of things you can do on your own. Experts have done a lot of research about which lifestyle changes and preventive measures are most likely to keep you healthy. Ask your health care provider for more information. Weight and diet Eat a healthy diet  Be sure to include plenty of vegetables, fruits, low-fat dairy products, and lean protein.  Do not eat a lot of foods high in solid fats, added sugars, or salt.  Get regular exercise. This is one of the most important things you can do for your health. ? Most adults should exercise for at least 150 minutes each week. The exercise should increase your heart rate and make you sweat (moderate-intensity exercise). ? Most adults should also do strengthening exercises at least twice a week. This is in addition to the moderate-intensity exercise.  Maintain a healthy weight  Body mass index (BMI) is a measurement that can be used to identify possible weight problems. It estimates body fat based on height and weight. Your health care provider can help determine your BMI  and help you achieve or maintain a healthy weight.  For females 75 years of age and older: ? A BMI below 18.5 is considered underweight. ? A BMI of 18.5 to 24.9 is normal. ? A BMI of 25 to 29.9 is considered overweight. ? A BMI of 30 and above is considered obese.  Watch levels of cholesterol and blood lipids  You should start having your blood tested for lipids and cholesterol at 67 years of age, then have this test every 5 years.  You may need to have your cholesterol levels checked more often if: ? Your lipid or cholesterol levels are high. ? You are older than 67 years of age. ? You are at high risk for heart disease.  Cancer screening Lung Cancer  Lung cancer screening is recommended for adults 37-64 years old who are at high risk for lung cancer because of a history of smoking.  A yearly low-dose CT scan of the lungs is recommended for people who: ? Currently smoke. ? Have quit within the past 15 years. ? Have at least a 30-pack-year history of smoking. A pack year is smoking an average of one pack of cigarettes a day for 1 year.  Yearly screening should continue until it has been 15 years since you quit.  Yearly screening should stop if you develop a health problem that would prevent you from having lung cancer treatment.  Breast Cancer  Practice breast self-awareness. This means understanding how your breasts normally appear and feel.  It also means doing regular breast self-exams. Let your health care  provider know about any changes, no matter how small.  If you are in your 20s or 30s, you should have a clinical breast exam (CBE) by a health care provider every 1-3 years as part of a regular health exam.  If you are 56 or older, have a CBE every year. Also consider having a breast X-ray (mammogram) every year.  If you have a family history of breast cancer, talk to your health care provider about genetic screening.  If you are at high risk for breast cancer, talk  to your health care provider about having an MRI and a mammogram every year.  Breast cancer gene (BRCA) assessment is recommended for women who have family members with BRCA-related cancers. BRCA-related cancers include: ? Breast. ? Ovarian. ? Tubal. ? Peritoneal cancers.  Results of the assessment will determine the need for genetic counseling and BRCA1 and BRCA2 testing.  Cervical Cancer Your health care provider may recommend that you be screened regularly for cancer of the pelvic organs (ovaries, uterus, and vagina). This screening involves a pelvic examination, including checking for microscopic changes to the surface of your cervix (Pap test). You may be encouraged to have this screening done every 3 years, beginning at age 36.  For women ages 90-65, health care providers may recommend pelvic exams and Pap testing every 3 years, or they may recommend the Pap and pelvic exam, combined with testing for human papilloma virus (HPV), every 5 years. Some types of HPV increase your risk of cervical cancer. Testing for HPV may also be done on women of any age with unclear Pap test results.  Other health care providers may not recommend any screening for nonpregnant women who are considered low risk for pelvic cancer and who do not have symptoms. Ask your health care provider if a screening pelvic exam is right for you.  If you have had past treatment for cervical cancer or a condition that could lead to cancer, you need Pap tests and screening for cancer for at least 20 years after your treatment. If Pap tests have been discontinued, your risk factors (such as having a new sexual partner) need to be reassessed to determine if screening should resume. Some women have medical problems that increase the chance of getting cervical cancer. In these cases, your health care provider may recommend more frequent screening and Pap tests.  Colorectal Cancer  This type of cancer can be detected and often  prevented.  Routine colorectal cancer screening usually begins at 67 years of age and continues through 67 years of age.  Your health care provider may recommend screening at an earlier age if you have risk factors for colon cancer.  Your health care provider may also recommend using home test kits to check for hidden blood in the stool.  A small camera at the end of a tube can be used to examine your colon directly (sigmoidoscopy or colonoscopy). This is done to check for the earliest forms of colorectal cancer.  Routine screening usually begins at age 4.  Direct examination of the colon should be repeated every 5-10 years through 67 years of age. However, you may need to be screened more often if early forms of precancerous polyps or small growths are found.  Skin Cancer  Check your skin from head to toe regularly.  Tell your health care provider about any new moles or changes in moles, especially if there is a change in a mole's shape or color.  Also tell your  you have a mole that is larger than the size of a pencil eraser.  Always use sunscreen. Apply sunscreen liberally and repeatedly throughout the day.  Protect yourself by wearing long sleeves, pants, a wide-brimmed hat, and sunglasses whenever you are outside.  Heart disease, diabetes, and high blood pressure  High blood pressure causes heart disease and increases the risk of stroke. High blood pressure is more likely to develop in: ? People who have blood pressure in the high end of the normal range (130-139/85-89 mm Hg). ? People who are overweight or obese. ? People who are African American.  If you are 18-39 years of age, have your blood pressure checked every 3-5 years. If you are 40 years of age or older, have your blood pressure checked every year. You should have your blood pressure measured twice-once when you are at a hospital or clinic, and once when you are not at a hospital or clinic. Record the average of the  two measurements. To check your blood pressure when you are not at a hospital or clinic, you can use: ? An automated blood pressure machine at a pharmacy. ? A home blood pressure monitor.  If you are between 55 years and 79 years old, ask your health care provider if you should take aspirin to prevent strokes.  Have regular diabetes screenings. This involves taking a blood sample to check your fasting blood sugar level. ? If you are at a normal weight and have a low risk for diabetes, have this test once every three years after 67 years of age. ? If you are overweight and have a high risk for diabetes, consider being tested at a younger age or more often. Preventing infection Hepatitis B  If you have a higher risk for hepatitis B, you should be screened for this virus. You are considered at high risk for hepatitis B if: ? You were born in a country where hepatitis B is common. Ask your health care provider which countries are considered high risk. ? Your parents were born in a high-risk country, and you have not been immunized against hepatitis B (hepatitis B vaccine). ? You have HIV or AIDS. ? You use needles to inject street drugs. ? You live with someone who has hepatitis B. ? You have had sex with someone who has hepatitis B. ? You get hemodialysis treatment. ? You take certain medicines for conditions, including cancer, organ transplantation, and autoimmune conditions.  Hepatitis C  Blood testing is recommended for: ? Everyone born from 1945 through 1965. ? Anyone with known risk factors for hepatitis C.  Sexually transmitted infections (STIs)  You should be screened for sexually transmitted infections (STIs) including gonorrhea and chlamydia if: ? You are sexually active and are younger than 67 years of age. ? You are older than 67 years of age and your health care provider tells you that you are at risk for this type of infection. ? Your sexual activity has changed since you  were last screened and you are at an increased risk for chlamydia or gonorrhea. Ask your health care provider if you are at risk.  If you do not have HIV, but are at risk, it may be recommended that you take a prescription medicine daily to prevent HIV infection. This is called pre-exposure prophylaxis (PrEP). You are considered at risk if: ? You are sexually active and do not regularly use condoms or know the HIV status of your partner(s). ? You take drugs by   injection. ? You are sexually active with a partner who has HIV.  Talk with your health care provider about whether you are at high risk of being infected with HIV. If you choose to begin PrEP, you should first be tested for HIV. You should then be tested every 3 months for as long as you are taking PrEP. Pregnancy  If you are premenopausal and you may become pregnant, ask your health care provider about preconception counseling.  If you may become pregnant, take 400 to 800 micrograms (mcg) of folic acid every day.  If you want to prevent pregnancy, talk to your health care provider about birth control (contraception). Osteoporosis and menopause  Osteoporosis is a disease in which the bones lose minerals and strength with aging. This can result in serious bone fractures. Your risk for osteoporosis can be identified using a bone density scan.  If you are 65 years of age or older, or if you are at risk for osteoporosis and fractures, ask your health care provider if you should be screened.  Ask your health care provider whether you should take a calcium or vitamin D supplement to lower your risk for osteoporosis.  Menopause may have certain physical symptoms and risks.  Hormone replacement therapy may reduce some of these symptoms and risks. Talk to your health care provider about whether hormone replacement therapy is right for you. Follow these instructions at home:  Schedule regular health, dental, and eye exams.  Stay current  with your immunizations.  Do not use any tobacco products including cigarettes, chewing tobacco, or electronic cigarettes.  If you are pregnant, do not drink alcohol.  If you are breastfeeding, limit how much and how often you drink alcohol.  Limit alcohol intake to no more than 1 drink per day for nonpregnant women. One drink equals 12 ounces of beer, 5 ounces of wine, or 1 ounces of hard liquor.  Do not use street drugs.  Do not share needles.  Ask your health care provider for help if you need support or information about quitting drugs.  Tell your health care provider if you often feel depressed.  Tell your health care provider if you have ever been abused or do not feel safe at home. This information is not intended to replace advice given to you by your health care provider. Make sure you discuss any questions you have with your health care provider. Document Released: 05/09/2011 Document Revised: 03/31/2016 Document Reviewed: 07/28/2015 Elsevier Interactive Patient Education  2018 [employer]  

## 2017-04-27 NOTE — Progress Notes (Signed)
Subjective:    Patient ID: Kaylee Cardenas, female    DOB: 01/06/1951, 66 y.o.   MRN: [id number]  HPI She is here for a physical exam.   She checks her BP on occasion.  It is typically around what it is here today.    She has intermittent abdominal pain.  She has diverticulosis and feels she gets flares intermittently.  She is trying to figure out what foods trigger her symptoms.   Medications and allergies reviewed with patient and updated if appropriate.  Patient Active Problem List   Diagnosis Date Noted  . Osteopenia 04/27/2017  . Essential hypertension 02/03/2016    Current Outpatient Prescriptions on File Prior to Visit  Medication Sig Dispense Refill  . amLODipine (NORVASC) 5 MG tablet Take 1 tablet (5 mg total) by mouth daily. 90 tablet 3  . Bioflavonoid Products (ESTER C PO) Take by mouth.    . Biotin 5000 MCG CAPS Take by mouth.    . Calcium Carb-Cholecalciferol (CALCIUM 600 + D PO) Take by mouth.    . cyanocobalamin 100 MCG tablet Take 100 mcg by mouth daily.    . fish oil-omega-3 fatty acids 1000 MG capsule Take 2 g by mouth daily.    . Lactobacillus (PROBIOTIC ACIDOPHILUS PO) Take by mouth.    . Multiple Vitamins-Minerals (MULTIVITAMIN WITH MINERALS) tablet Take 1 tablet by mouth daily.     No current facility-administered medications on file prior to visit.     Past Medical History:  Diagnosis Date  . Abnormal mammogram   . Colon polyps   . Dizzy spells   . Headache, migraine   . Kidney stones   . Migraine   . Osteopenia   . Osteoporosis     Past Surgical History:  Procedure Laterality Date  . ABDOMINAL HYSTERECTOMY      Social History   Social History  . Marital status: Single    Spouse name: N/A  . Number of children: N/A  . Years of education: N/A   Social History Main Topics  . Smoking status: Never Smoker  . Smokeless tobacco: Never Used  . Alcohol use No  . Drug use: Unknown  . Sexual activity: Not Asked   Other Topics Concern    . None   Social History Narrative   Exercise: occasional fund raising walks, yard work    Family History  Problem Relation Age of Onset  . Arthritis Mother   . Cancer Mother        colon  . Kidney disease Mother   . Diabetes Mother   . Diabetes Sister   . Hypertension Sister   . Kidney disease Brother   . Diabetes Brother   . Hypertension Brother   . Mental illness Brother   . Cancer Son        prostate    Review of Systems  Constitutional: Negative for chills and fever.  Eyes: Positive for visual disturbance (occ blurry vision when she does not eat breakfast).  Respiratory: Negative for cough, shortness of breath and wheezing.   Cardiovascular: Negative for chest pain, palpitations and leg swelling.  Gastrointestinal: Positive for abdominal pain (intermittent - flares of ? diverticulosis). Negative for blood in stool, constipation, diarrhea and nausea.       No gerd  Genitourinary: Negative for dysuria and hematuria.  Musculoskeletal: Positive for arthralgias (hip pain intermittently). Negative for back pain.  Skin: Negative for color change and rash.  Neurological: Negative for light-headedness and headaches.  Psychiatric/Behavioral: Negative for dysphoric mood. The patient is not nervous/anxious.        Objective:   Vitals:   04/27/17 0812  BP: 140/84  Pulse: (!) 58  Resp: 16  Temp: 98.7 F (37.1 C)   Filed Weights   04/27/17 0812  Weight: 195 lb (88.5 kg)   Body mass index is 31.96 kg/m.  Wt Readings from Last 3 Encounters:  04/27/17 195 lb (88.5 kg)  09/07/16 190 lb (86.2 kg)  05/13/16 191 lb (86.6 kg)     Physical Exam Constitutional: She appears well-developed and well-nourished. No distress.  HENT:  Head: Normocephalic and atraumatic.  Right Ear: External ear normal. Normal ear canal and TM Left Ear: External ear normal.  Normal ear canal and TM Mouth/Throat: Oropharynx is clear and moist.  Eyes: Conjunctivae and EOM are normal.  Neck:  Neck supple. No tracheal deviation present. No thyromegaly present.  No carotid bruit  Cardiovascular: Normal rate, regular rhythm and normal heart sounds.   No murmur heard.  No edema. Pulmonary/Chest: Effort normal and breath sounds normal. No respiratory distress. She has no wheezes. She has no rales.  Breast: deferred to Gyn Abdominal: Soft. She exhibits no distension. There is no tenderness.  Lymphadenopathy: She has no cervical adenopathy.  Skin: Skin is warm and dry. She is not diaphoretic.  Psychiatric: She has a normal mood and affect. Her behavior is normal.         Assessment & Plan:   Physical exam: Screening blood work  ordered Immunizations   Up to date, discussed shingrix ( zostavax 2017) Colonoscopy   Up to date  - due this year Mammogram   Up to date  Gyn    Up to date  Dexa - managed by gyn - last ? 3 years ago Eye exams   Up to date  EKG   Done 01/2016 Exercise - walking irregularly - stressed more regular walking Weight - advised weight loss Skin   No concerns Substance abuse   none  See Problem List for Assessment and Plan of chronic medical problems.  FU annually

## 2017-04-27 NOTE — Assessment & Plan Note (Signed)
Managed by gyn Taking calcium and vitamin d Walking - advised to increase and walk regularly

## 2017-04-27 NOTE — Assessment & Plan Note (Signed)
Checks her BP at [facility] - has been about what it is today continue current medication at current dose

## 2017-07-25 ENCOUNTER — Other Ambulatory Visit: Payer: Self-pay | Admitting: Internal Medicine

## 2017-07-25 DIAGNOSIS — Z1231 Encounter for screening mammogram for malignant neoplasm of breast: Secondary | ICD-10-CM

## 2017-08-03 LAB — HM COLONOSCOPY

## 2017-08-08 ENCOUNTER — Ambulatory Visit
Admission: RE | Admit: 2017-08-08 | Discharge: 2017-08-08 | Disposition: A | Discharge status: Home / Self Care | Payer: Managed Care, Other (non HMO) | Source: Ambulatory Visit | Attending: Internal Medicine | Admitting: Internal Medicine

## 2017-08-08 ENCOUNTER — Ambulatory Visit: Payer: Managed Care, Other (non HMO)

## 2017-08-08 DIAGNOSIS — Z1231 Encounter for screening mammogram for malignant neoplasm of breast: Secondary | ICD-10-CM

## 2017-08-09 ENCOUNTER — Other Ambulatory Visit: Payer: Self-pay | Admitting: Internal Medicine

## 2017-08-09 DIAGNOSIS — R928 Other abnormal and inconclusive findings on diagnostic imaging of breast: Secondary | ICD-10-CM

## 2017-08-18 ENCOUNTER — Ambulatory Visit
Admission: RE | Admit: 2017-08-18 | Discharge: 2017-08-18 | Disposition: A | Discharge status: Home / Self Care | Payer: BLUE CROSS/BLUE SHIELD | Source: Ambulatory Visit | Attending: Internal Medicine | Admitting: Internal Medicine

## 2017-08-18 DIAGNOSIS — R928 Other abnormal and inconclusive findings on diagnostic imaging of breast: Secondary | ICD-10-CM

## 2017-09-15 ENCOUNTER — Other Ambulatory Visit: Payer: Self-pay | Admitting: Internal Medicine

## 2018-05-01 ENCOUNTER — Ambulatory Visit: Payer: BLUE CROSS/BLUE SHIELD | Admitting: Internal Medicine

## 2018-05-01 ENCOUNTER — Encounter: Payer: Self-pay | Admitting: Internal Medicine

## 2018-05-01 VITALS — BP 126/80 | HR 60 | Temp 98.4°F | Ht 65.5 in | Wt 188.0 lb

## 2018-05-01 DIAGNOSIS — I10 Essential (primary) hypertension: Secondary | ICD-10-CM

## 2018-05-01 DIAGNOSIS — Z0000 Encounter for general adult medical examination without abnormal findings: Secondary | ICD-10-CM

## 2018-05-01 DIAGNOSIS — Z23 Encounter for immunization: Secondary | ICD-10-CM | POA: Diagnosis not present

## 2018-05-01 DIAGNOSIS — R7303 Prediabetes: Secondary | ICD-10-CM

## 2018-05-01 DIAGNOSIS — M8580 Other specified disorders of bone density and structure, unspecified site: Secondary | ICD-10-CM | POA: Diagnosis not present

## 2018-05-01 NOTE — Assessment & Plan Note (Signed)
Gyn checking dexa, taking calcium and vitamin D.

## 2018-05-01 NOTE — Progress Notes (Signed)
   Subjective:    Patient ID: Kaylee Cardenas, female    DOB: 05-26-1951, 67 y.o.   MRN: [id number]  HPI The patient is a 67 YO female coming in for physical. No new concerns.   Colonoscopy Dr. Elnoria HowardHung   [facility], [facility], social history reviewed and updated.   Review of Systems  Constitutional: Negative.   HENT: Negative.   Eyes: Negative.   Respiratory: Negative for cough, chest tightness and shortness of breath.   Cardiovascular: Negative for chest pain, palpitations and leg swelling.  Gastrointestinal: Negative for abdominal distention, abdominal pain, constipation, diarrhea, nausea and vomiting.  Musculoskeletal: Negative.   Skin: Negative.   Neurological: Negative.   Psychiatric/Behavioral: Negative.       Objective:   Physical Exam  Constitutional: She is oriented to person, place, and time. She appears well-developed and well-nourished.  HENT:  Head: Normocephalic and atraumatic.  Eyes: EOM are normal.  Neck: Normal range of motion.  Cardiovascular: Normal rate and regular rhythm.  Pulmonary/Chest: Effort normal and breath sounds normal. No respiratory distress. She has no wheezes. She has no rales.  Abdominal: Soft. Bowel sounds are normal. She exhibits no distension. There is no tenderness. There is no rebound.  Musculoskeletal: She exhibits no edema.  Neurological: She is alert and oriented to person, place, and time. Coordination normal.  Skin: Skin is warm and dry.  Psychiatric: She has a normal mood and affect.   Vitals:   05/01/18 0915  BP: 126/80  Pulse: 60  Temp: 98.4 F (36.9 C)  TempSrc: Oral  SpO2: 98%  Weight: 188 lb (85.3 kg)  Height: 5' 5.5" (1.664 m)      Assessment & Plan:  Pneumonia 23 given at visit

## 2018-05-01 NOTE — Assessment & Plan Note (Signed)
Checking HgA1c, last stable.

## 2018-05-01 NOTE — Assessment & Plan Note (Signed)
BP at goal on amlodipine 5 mg daily, checking CMP and adjust as needed.  

## 2018-05-01 NOTE — Patient Instructions (Signed)

## 2018-05-01 NOTE — Assessment & Plan Note (Signed)
Flu shot yearly. Pneumonia 23 given at visit. Shingrix counseled. Tetanus up to date. Colonoscopy up to date need records from Dr. Elnoria HowardHung. Mammogram up to date, pap smear not indicated and dexa up to date. Counseled about sun safety and mole surveillance. Counseled about the dangers of distracted driving. Given 10 year screening recommendations.

## 2018-05-08 ENCOUNTER — Encounter: Payer: Self-pay | Admitting: Internal Medicine

## 2018-05-08 NOTE — Progress Notes (Signed)
Abstracted and sent to scan  

## 2018-07-13 ENCOUNTER — Other Ambulatory Visit: Payer: Self-pay | Admitting: Internal Medicine

## 2018-07-13 DIAGNOSIS — Z1231 Encounter for screening mammogram for malignant neoplasm of breast: Secondary | ICD-10-CM

## 2018-08-15 ENCOUNTER — Ambulatory Visit
Admission: RE | Admit: 2018-08-15 | Discharge: 2018-08-15 | Disposition: A | Discharge status: Home / Self Care | Payer: BLUE CROSS/BLUE SHIELD | Source: Ambulatory Visit | Attending: Internal Medicine | Admitting: Internal Medicine

## 2018-08-15 DIAGNOSIS — Z1231 Encounter for screening mammogram for malignant neoplasm of breast: Secondary | ICD-10-CM

## 2019-03-21 ENCOUNTER — Ambulatory Visit: Payer: Self-pay | Admitting: *Deleted

## 2019-03-21 ENCOUNTER — Ambulatory Visit (INDEPENDENT_AMBULATORY_CARE_PROVIDER_SITE_OTHER): Payer: Medicare Other | Admitting: Internal Medicine

## 2019-03-21 ENCOUNTER — Encounter: Payer: Self-pay | Admitting: Internal Medicine

## 2019-03-21 DIAGNOSIS — R1030 Lower abdominal pain, unspecified: Secondary | ICD-10-CM | POA: Diagnosis not present

## 2019-03-21 MED ORDER — TIZANIDINE HCL 4 MG PO TABS: 4.0000 mg | ORAL_TABLET | Freq: Four times a day (QID) | ORAL | 0 refills | Status: DC | PRN

## 2019-03-21 MED ORDER — AMOXICILLIN-POT CLAVULANATE 875-125 MG PO TABS: 1.0000 | ORAL_TABLET | Freq: Two times a day (BID) | ORAL | 0 refills | Status: DC

## 2019-03-21 MED ORDER — ONDANSETRON HCL 4 MG PO TABS: 4.0000 mg | ORAL_TABLET | Freq: Three times a day (TID) | ORAL | 0 refills | Status: DC | PRN

## 2019-03-21 NOTE — Telephone Encounter (Signed)
Visit scheduled today.

## 2019-03-21 NOTE — Assessment & Plan Note (Signed)
Checking CBC, CMP, lipase, HgA1c. Depending on results will have her start augmentin if elevated WBC. Rx for zofran for nausea and tizanidine for pain. Advised also to try tylenol for pain.

## 2019-03-21 NOTE — Telephone Encounter (Signed)
Upper right-sided abdominal pain that began 6 days ago and worsening now.does not radiate.  Had some nausea a few days ago that has resolved. Sharp pain when she stretches out. Constant discomfort. Nausea with decreased appetite.  No fever/SOB/CP. No difficulty voiding. Loose stool today but ate a salad last night with history of diverticulitis. Has not tried antacids. Reviewed care advice. Transferred for virtual appointment. Routing to PCP. Prefers appointment link be sent to her email [email or web address]. Reason for Disposition . [1] MODERATE pain (e.g., interferes with normal activities) AND [2] comes and goes (cramps) AND [3] present > 24 hours  (Exception: pain with Vomiting or Diarrhea - see that Guideline)  Answer Assessment - Initial Assessment Questions 1. LOCATION: "Where does it hurt?"      Right sided under the breast pain 2. RADIATION: "Does the pain shoot anywhere else?" (e.g., chest, back)     no 3. ONSET: "When did the pain begin?" (e.g., minutes, hours or days ago)      Started with issues last Friday 5/8. 4. SUDDEN: "Gradual or sudden onset?"     Sudden and now lingering. 5. PATTERN "Does the pain come and go, or is it constant?"    - If constant: "Is it getting better, staying the same, or worsening?"      (Note: Constant means the pain never goes away completely; most serious pain is constant and it progresses)     - If intermittent: "How long does it last?" "Do you have pain now?"     (Note: Intermittent means the pain goes away completely between bouts)     When she get up the morning the pain is worse. Discomfort is constant. 6. SEVERITY: "How bad is the pain?"  (e.g., Scale 1-10; mild, moderate, or severe)    - MILD (1-3): doesn't interfere with normal activities, abdomen soft and not tender to touch     - MODERATE (4-7): interferes with normal activities or awakens from sleep, tender to touch     - SEVERE (8-10): excruciating pain, doubled over, unable to do any normal  activities       7-10 varies. 7. RECURRENT SYMPTOM: "Have you ever had this type of abdominal pain before?" If so, ask: "When was the last time?" and "What happened that time?"      no 8. AGGRAVATING FACTORS: "Does anything seem to cause this pain?" (e.g., foods, stress, alcohol)     Moving around increases the pain. 9. CARDIAC SYMPTOMS: "Do you have any of the following symptoms: chest pain, difficulty breathing, sweating, nausea?"     Nausea only at no particular time.  10. OTHER SYMPTOMS: "Do you have any other symptoms?" (e.g., fever, vomiting, diarrhea)       Vomiting a couple of times 2 days ago.none now. Diarrhea this morning but she did eat a salad last night she thinks causing diarrhea this morning. 11. PREGNANCY: "Is there any chance you are pregnant?" "When was your last menstrual period?"       no  Protocols used: ABDOMINAL PAIN - UPPER-A-AH

## 2019-03-21 NOTE — Progress Notes (Signed)
Virtual Visit via Video Note  I connected with Kaylee Cardenas on 03/21/19 at  3:00 PM EDT by a video enabled telemedicine application and verified that I am speaking with the correct person using two identifiers.  The patient and the provider were at separate locations throughout the entire encounter.   I discussed the limitations of evaluation and management by telemedicine and the availability of in person appointments. The patient expressed understanding and agreed to proceed.  History of Present Illness: The patient is a 69 y.o. female with visit for abdominal pain. She thought it was from her left hip and was soaking in epsom salt. Started last Friday. Pain is 6-8/10. Best when lying down. Hurts worse with prolonged standing. Some nausea and vomiting about 2-3 days ago. 3 episodes of diarrhea today. She has poor appetite and is not eating well in the last 3 days or so. No blood in vomit or diarrhea. Ate some soap and crackers today. Drinking fluids. Has no fevers or chills. Denies cough or SOB. Overall it is pain improving, nausea and vomiting stable. Has tried nothing for it.  Observations/Objective: Appearance: normal, breathing appears normal, casual grooming, abdomen does not appear distended, mental status is A and O times 3  Assessment and Plan: See problem oriented charting  Follow Up Instructions: rx zofran, tizanidine, augmentin  I discussed the assessment and treatment plan with the patient. The patient was provided an opportunity to ask questions and all were answered. The patient agreed with the plan and demonstrated an understanding of the instructions.   The patient was advised to call back or seek an in-person evaluation if the symptoms worsen or if the condition fails to improve as anticipated.  Myrlene Broker, MD

## 2019-03-22 ENCOUNTER — Telehealth: Payer: Self-pay | Admitting: Internal Medicine

## 2019-03-22 ENCOUNTER — Other Ambulatory Visit (INDEPENDENT_AMBULATORY_CARE_PROVIDER_SITE_OTHER): Payer: Medicare Other

## 2019-03-22 DIAGNOSIS — R1030 Lower abdominal pain, unspecified: Secondary | ICD-10-CM | POA: Diagnosis not present

## 2019-03-22 LAB — COMPREHENSIVE METABOLIC PANEL
ALT: 28 U/L (ref 0–35)
AST: 28 U/L (ref 0–37)
Albumin: 3.2 g/dL — ABNORMAL LOW (ref 3.5–5.2)
Alkaline Phosphatase: 104 U/L (ref 39–117)
BUN: 18 mg/dL (ref 6–23)
CO2: 26 mEq/L (ref 19–32)
Calcium: 8.8 mg/dL (ref 8.4–10.5)
Chloride: 100 mEq/L (ref 96–112)
Creatinine, Ser: 1.35 mg/dL — ABNORMAL HIGH (ref 0.40–1.20)
GFR: 47 mL/min — ABNORMAL LOW (ref >60.00)
Glucose, Bld: 109 mg/dL — ABNORMAL HIGH (ref 70–99)
Potassium: 4 mEq/L (ref 3.5–5.1)
Sodium: 139 mEq/L (ref 135–145)
Total Bilirubin: 0.8 mg/dL (ref 0.2–1.2)
Total Protein: 7.5 g/dL (ref 6.0–8.3)

## 2019-03-22 LAB — CBC
HCT: 37.6 % (ref 36.0–46.0)
Hemoglobin: 12.5 g/dL (ref 12.0–15.0)
MCHC: 33.3 g/dL (ref 30.0–36.0)
MCV: 87.9 fl (ref 78.0–100.0)
Platelets: 263 10*3/uL (ref 150.0–400.0)
RBC: 4.28 Mil/uL (ref 3.87–5.11)
RDW: 14.1 % (ref 11.5–15.5)
WBC: 19.2 10*3/uL (ref 4.0–10.5)

## 2019-03-22 LAB — LIPID PANEL
Cholesterol: 145 mg/dL (ref 0–200)
HDL: 37.8 mg/dL — ABNORMAL LOW (ref >39.00)
LDL Cholesterol: 90 mg/dL (ref 0–99)
NonHDL: 107.61
Total CHOL/HDL Ratio: 4
Triglycerides: 89 mg/dL (ref 0.0–149.0)
VLDL: 17.8 mg/dL (ref 0.0–40.0)

## 2019-03-22 LAB — HEMOGLOBIN A1C: Hgb A1c MFr Bld: 6 % (ref 4.6–6.5)

## 2019-03-22 LAB — LIPASE: Lipase: 63 U/L — ABNORMAL HIGH (ref 11.0–59.0)

## 2019-03-22 NOTE — Telephone Encounter (Signed)
Critical call from lab earlier with WBC 19.2. This is not resulted in chart still and lab just closed for day. Please call patient and make sure she is taking augmentin BID and she should continue this as it is likely stomach infection as we talked about.

## 2019-03-22 NOTE — Telephone Encounter (Signed)
Patient informed of MD response states that she started the Augmentin today has not been able to eat much but will continue to take medication

## 2019-05-07 ENCOUNTER — Ambulatory Visit (INDEPENDENT_AMBULATORY_CARE_PROVIDER_SITE_OTHER)
Admission: RE | Admit: 2019-05-07 | Discharge: 2019-05-07 | Disposition: A | Discharge status: Home / Self Care | Payer: Medicare Other | Source: Ambulatory Visit | Attending: Internal Medicine | Admitting: Internal Medicine

## 2019-05-07 ENCOUNTER — Other Ambulatory Visit: Payer: Self-pay

## 2019-05-07 ENCOUNTER — Encounter: Payer: Self-pay | Admitting: Internal Medicine

## 2019-05-07 ENCOUNTER — Other Ambulatory Visit (INDEPENDENT_AMBULATORY_CARE_PROVIDER_SITE_OTHER): Payer: Medicare Other

## 2019-05-07 ENCOUNTER — Ambulatory Visit (INDEPENDENT_AMBULATORY_CARE_PROVIDER_SITE_OTHER): Payer: Medicare Other | Admitting: Internal Medicine

## 2019-05-07 VITALS — BP 150/70 | HR 55 | Temp 98.1°F | Ht 65.5 in | Wt 176.0 lb

## 2019-05-07 DIAGNOSIS — E2839 Other primary ovarian failure: Secondary | ICD-10-CM | POA: Diagnosis not present

## 2019-05-07 DIAGNOSIS — Z0000 Encounter for general adult medical examination without abnormal findings: Secondary | ICD-10-CM

## 2019-05-07 DIAGNOSIS — I10 Essential (primary) hypertension: Secondary | ICD-10-CM | POA: Diagnosis not present

## 2019-05-07 DIAGNOSIS — M8580 Other specified disorders of bone density and structure, unspecified site: Secondary | ICD-10-CM | POA: Diagnosis not present

## 2019-05-07 DIAGNOSIS — R7303 Prediabetes: Secondary | ICD-10-CM

## 2019-05-07 LAB — COMPREHENSIVE METABOLIC PANEL
ALT: 9 U/L (ref 0–35)
AST: 13 U/L (ref 0–37)
Albumin: 3.9 g/dL (ref 3.5–5.2)
Alkaline Phosphatase: 71 U/L (ref 39–117)
BUN: 12 mg/dL (ref 6–23)
CO2: 30 mEq/L (ref 19–32)
Calcium: 8.9 mg/dL (ref 8.4–10.5)
Chloride: 105 mEq/L (ref 96–112)
Creatinine, Ser: 0.93 mg/dL (ref 0.40–1.20)
GFR: 72.23 mL/min (ref >60.00)
Glucose, Bld: 58 mg/dL — ABNORMAL LOW (ref 70–99)
Potassium: 3.5 mEq/L (ref 3.5–5.1)
Sodium: 141 mEq/L (ref 135–145)
Total Bilirubin: 0.4 mg/dL (ref 0.2–1.2)
Total Protein: 7.1 g/dL (ref 6.0–8.3)

## 2019-05-07 LAB — CBC WITH DIFFERENTIAL/PLATELET
Basophils Absolute: 0 10*3/uL (ref 0.0–0.1)
Basophils Relative: 1 % (ref 0.0–3.0)
Eosinophils Absolute: 0.1 10*3/uL (ref 0.0–0.7)
Eosinophils Relative: 1.9 % (ref 0.0–5.0)
HCT: 41.3 % (ref 36.0–46.0)
Hemoglobin: 13.5 g/dL (ref 12.0–15.0)
Lymphocytes Relative: 44.3 % (ref 12.0–46.0)
Lymphs Abs: 2.1 10*3/uL (ref 0.7–4.0)
MCHC: 32.5 g/dL (ref 30.0–36.0)
MCV: 90.5 fl (ref 78.0–100.0)
Monocytes Absolute: 0.3 10*3/uL (ref 0.1–1.0)
Monocytes Relative: 7.5 % (ref 3.0–12.0)
Neutro Abs: 2.1 10*3/uL (ref 1.4–7.7)
Neutrophils Relative %: 45.3 % (ref 43.0–77.0)
Platelets: 157 10*3/uL (ref 150.0–400.0)
RBC: 4.57 Mil/uL (ref 3.87–5.11)
RDW: 16.5 % — ABNORMAL HIGH (ref 11.5–15.5)
WBC: 4.7 10*3/uL (ref 4.0–10.5)

## 2019-05-07 MED ORDER — TIZANIDINE HCL 4 MG PO TABS: 4.0000 mg | ORAL_TABLET | Freq: Four times a day (QID) | ORAL | 0 refills | Status: DC | PRN

## 2019-05-07 NOTE — Assessment & Plan Note (Signed)
Needs repeat bone density. Prior with gyn and we do not have those records and she cannot recall exact date of last.

## 2019-05-07 NOTE — Patient Instructions (Signed)
Health Maintenance, Female Adopting a healthy lifestyle and getting preventive care are important in promoting health and wellness. Ask your health care provider about:  The right schedule for you to have regular tests and exams.  Things you can do on your own to prevent diseases and keep yourself healthy. What should I know about diet, weight, and exercise? Eat a healthy diet   Eat a diet that includes plenty of vegetables, fruits, low-fat dairy products, and lean protein.  Do not eat a lot of foods that are high in solid fats, added sugars, or sodium. Maintain a healthy weight Body mass index (BMI) is used to identify weight problems. It estimates body fat based on height and weight. Your health care provider can help determine your BMI and help you achieve or maintain a healthy weight. Get regular exercise Get regular exercise. This is one of the most important things you can do for your health. Most adults should:  Exercise for at least 150 minutes each week. The exercise should increase your heart rate and make you sweat (moderate-intensity exercise).  Do strengthening exercises at least twice a week. This is in addition to the moderate-intensity exercise.  Spend less time sitting. Even light physical activity can be beneficial. Watch cholesterol and blood lipids Have your blood tested for lipids and cholesterol at 69 years of age, then have this test every 5 years. Have your cholesterol levels checked more often if:  Your lipid or cholesterol levels are high.  You are older than 69 years of age.  You are at high risk for heart disease. What should I know about cancer screening? Depending on your health history and family history, you may need to have cancer screening at various ages. This may include screening for:  Breast cancer.  Cervical cancer.  Colorectal cancer.  Skin cancer.  Lung cancer. What should I know about heart disease, diabetes, and high blood  pressure? Blood pressure and heart disease  High blood pressure causes heart disease and increases the risk of stroke. This is more likely to develop in people who have high blood pressure readings, are of African descent, or are overweight.  Have your blood pressure checked: ? Every 3-5 years if you are 18-39 years of age. ? Every year if you are 40 years old or older. Diabetes Have regular diabetes screenings. This checks your fasting blood sugar level. Have the screening done:  Once every three years after age 40 if you are at a normal weight and have a low risk for diabetes.  More often and at a younger age if you are overweight or have a high risk for diabetes. What should I know about preventing infection? Hepatitis B If you have a higher risk for hepatitis B, you should be screened for this virus. Talk with your health care provider to find out if you are at risk for hepatitis B infection. Hepatitis C Testing is recommended for:  Everyone born from 1945 through 1965.  Anyone with known risk factors for hepatitis C. Sexually transmitted infections (STIs)  Get screened for STIs, including gonorrhea and chlamydia, if: ? You are sexually active and are younger than 69 years of age. ? You are older than 69 years of age and your health care provider tells you that you are at risk for this type of infection. ? Your sexual activity has changed since you were last screened, and you are at increased risk for chlamydia or gonorrhea. Ask your health care provider if   you are at risk.  Ask your health care provider about whether you are at high risk for HIV. Your health care provider may recommend a prescription medicine to help prevent HIV infection. If you choose to take medicine to prevent HIV, you should first get tested for HIV. You should then be tested every 3 months for as long as you are taking the medicine. Pregnancy  If you are about to stop having your period (premenopausal) and  you may become pregnant, seek counseling before you get pregnant.  Take 400 to 800 micrograms (mcg) of folic acid every day if you become pregnant.  Ask for birth control (contraception) if you want to prevent pregnancy. Osteoporosis and menopause Osteoporosis is a disease in which the bones lose minerals and strength with aging. This can result in bone fractures. If you are 65 years old or older, or if you are at risk for osteoporosis and fractures, ask your health care provider if you should:  Be screened for bone loss.  Take a calcium or vitamin D supplement to lower your risk of fractures.  Be given hormone replacement therapy (HRT) to treat symptoms of menopause. Follow these instructions at home: Lifestyle  Do not use any products that contain nicotine or tobacco, such as cigarettes, e-cigarettes, and chewing tobacco. If you need help quitting, ask your health care provider.  Do not use street drugs.  Do not share needles.  Ask your health care provider for help if you need support or information about quitting drugs. Alcohol use  Do not drink alcohol if: ? Your health care provider tells you not to drink. ? You are pregnant, may be pregnant, or are planning to become pregnant.  If you drink alcohol: ? Limit how much you use to 0-1 drink a day. ? Limit intake if you are breastfeeding.  Be aware of how much alcohol is in your drink. In the [place] one drink equals one 12 oz bottle of beer (355 mL), one 5 oz glass of wine (148 mL), or one 1 oz glass of hard liquor (44 mL). General instructions  Schedule regular health, dental, and eye exams.  Stay current with your vaccines.  Tell your health care provider if: ? You often feel depressed. ? You have ever been abused or do not feel safe at home. Summary  Adopting a healthy lifestyle and getting preventive care are important in promoting health and wellness.  Follow your health care provider's instructions about healthy  diet, exercising, and getting tested or screened for diseases.  Follow your health care provider's instructions on monitoring your cholesterol and blood pressure. This information is not intended to replace advice given to you by your health care provider. Make sure you discuss any questions you have with your health care provider. Document Released: 05/09/2011 Document Revised: 10/17/2018 Document Reviewed: 10/17/2018 Elsevier Patient Education  2020 [employer] Inc[employer]  

## 2019-05-07 NOTE — Assessment & Plan Note (Signed)
Amlodipine 5 mg daily and checking CMP. Adjust as needed. BP at goal at home.

## 2019-05-07 NOTE — Progress Notes (Signed)
Subjective:   Patient ID: Kaylee SilvanMary L Cardenas, female    DOB: 1951/08/05, 68 y.o.   MRN: [id number]  HPI Here for medicare wellness and physical, no new complaints. Please see A/P for status and treatment of chronic medical problems.   Diet: heart healthy Physical activity: sedentary Depression/mood screen: negative Hearing: intact to whispered voice Visual acuity: grossly normal, performs annual eye exam  ADLs: capable Fall risk: none Home safety: good Cognitive evaluation: intact to orientation, naming, recall and repetition EOL planning: adv directives discussed    [facility]07/[facility]  PHQ-2 Total Score  0      I have personally reviewed and have noted 1. The patient's medical and social history - reviewed today no changes 2. Their use of alcohol, tobacco or illicit drugs 3. Their current medications and supplements 4. The patient's functional ability including ADL's, fall risks, home safety risks and hearing or visual impairment. 5. Diet and physical activities 6. Evidence for depression or mood disorders 7. Care team reviewed and updated  Patient Care Team: Myrlene Brokerrawford, Yenifer Saccente A, MD as PCP - General (Internal Medicine) Past Medical History:  Diagnosis Date  . Abnormal mammogram   . Colon polyps   . Dizzy spells   . Headache, migraine   . Kidney stones   . Migraine   . Osteopenia   . Osteoporosis    Past Surgical History:  Procedure Laterality Date  . ABDOMINAL HYSTERECTOMY     Family History  Problem Relation Age of Onset  . Arthritis Mother   . Cancer Mother        colon  . Kidney disease Mother   . Diabetes Mother   . Diabetes Sister   . Hypertension Sister   . Kidney disease Brother   . Diabetes Brother   . Hypertension Brother   . Mental illness Brother   . Cancer Son        prostate  . Breast cancer Neg Hx    Review of Systems  Constitutional: Negative.   HENT: Negative.   Eyes: Negative.    Respiratory: Negative for cough, chest tightness and shortness of breath.   Cardiovascular: Negative for chest pain, palpitations and leg swelling.  Gastrointestinal: Negative for abdominal distention, abdominal pain, constipation, diarrhea, nausea and vomiting.  Musculoskeletal: Negative.   Skin: Negative.   Neurological: Negative.   Psychiatric/Behavioral: Negative.     Objective:  Physical Exam Constitutional:      Appearance: She is well-developed.  HENT:     Head: Normocephalic and atraumatic.  Neck:     Musculoskeletal: Normal range of motion.  Cardiovascular:     Rate and Rhythm: Normal rate and regular rhythm.  Pulmonary:     Effort: Pulmonary effort is normal. No respiratory distress.     Breath sounds: Normal breath sounds. No wheezing or rales.  Abdominal:     General: Bowel sounds are normal. There is no distension.     Palpations: Abdomen is soft.     Tenderness: There is no abdominal tenderness. There is no rebound.  Skin:    General: Skin is warm and dry.  Neurological:     Mental Status: She is alert and oriented to person, place, and time.     Coordination: Coordination normal.     Vitals:   05/07/19 0941  BP: (!) 150/70  Pulse: (!) 55  Temp: 98.1 F (36.7 C)  TempSrc: Oral  SpO2: 97%  Weight: 176 lb (79.8 kg)  Height: 5' 5.5" (1.664 m)    Assessment & Plan:

## 2019-05-07 NOTE — Assessment & Plan Note (Signed)
Recent HgA1c without progression and will continue yearly check.

## 2019-05-07 NOTE — Assessment & Plan Note (Signed)
Flu shot yearly. Pneumonia complete. Shingrix counseled. Tetanus due 2026. Colonoscopy due 2023. Mammogram due 2021, pap smear aged out and dexa ordered today. Counseled about sun safety and mole surveillance. Counseled about the dangers of distracted driving. Given 10 year screening recommendations.

## 2019-06-13 DIAGNOSIS — H25813 Combined forms of age-related cataract, bilateral: Secondary | ICD-10-CM | POA: Diagnosis not present

## 2019-06-13 DIAGNOSIS — H5203 Hypermetropia, bilateral: Secondary | ICD-10-CM | POA: Diagnosis not present

## 2019-06-13 DIAGNOSIS — H524 Presbyopia: Secondary | ICD-10-CM | POA: Diagnosis not present

## 2019-06-13 DIAGNOSIS — H52203 Unspecified astigmatism, bilateral: Secondary | ICD-10-CM | POA: Diagnosis not present

## 2019-07-16 ENCOUNTER — Other Ambulatory Visit: Payer: Self-pay | Admitting: Internal Medicine

## 2019-07-16 DIAGNOSIS — Z1231 Encounter for screening mammogram for malignant neoplasm of breast: Secondary | ICD-10-CM

## 2019-08-06 ENCOUNTER — Ambulatory Visit: Payer: Self-pay

## 2019-08-06 ENCOUNTER — Ambulatory Visit (INDEPENDENT_AMBULATORY_CARE_PROVIDER_SITE_OTHER): Payer: Medicare Other | Admitting: Orthopaedic Surgery

## 2019-08-06 ENCOUNTER — Encounter: Payer: Self-pay | Admitting: Orthopaedic Surgery

## 2019-08-06 ENCOUNTER — Other Ambulatory Visit: Payer: Self-pay

## 2019-08-06 DIAGNOSIS — M25511 Pain in right shoulder: Secondary | ICD-10-CM | POA: Diagnosis not present

## 2019-08-06 DIAGNOSIS — G8929 Other chronic pain: Secondary | ICD-10-CM

## 2019-08-06 MED ORDER — LIDOCAINE HCL 1 % IJ SOLN
3.0000 mL | INTRAMUSCULAR | Status: AC | PRN
  Administered 2019-08-06: 3 mL

## 2019-08-06 MED ORDER — METHYLPREDNISOLONE ACETATE 40 MG/ML IJ SUSP
40.0000 mg | INTRAMUSCULAR | Status: AC | PRN
  Administered 2019-08-06: 40 mg via INTRA_ARTICULAR

## 2019-08-06 MED ORDER — BUPIVACAINE HCL 0.5 % IJ SOLN
3.0000 mL | INTRAMUSCULAR | Status: AC | PRN
  Administered 2019-08-06: 3 mL via INTRA_ARTICULAR

## 2019-08-06 NOTE — Progress Notes (Signed)
Office Visit Note   Patient: Kaylee Cardenas           Date of Birth: 05-11-1951           MRN: [id number] Visit Date: 08/06/2019              Requested by: Hoyt Koch, MD [place],  [place] [id number] PCP: Hoyt Koch, MD   Assessment & Plan: Visit Diagnoses:  1. Chronic right shoulder pain     Plan: Impression is right shoulder pain likely rotator cuff tendinosis.  I recommend trial a subacromial injection and home exercises for the next 4 to 6 weeks.  If she does not improve from this then I would recommend an MRI to rule out structural abnormalities at that point.  Questions encouraged and answered.  Patient in agreement with the plan.  Follow-Up Instructions: Return if symptoms worsen or fail to improve.   Orders:  Orders Placed This Encounter  Procedures  . XR Shoulder Right   No orders of the defined types were placed in this encounter.     Procedures: Large Joint Inj: R subacromial bursa on 08/06/2019 5:01 PM Indications: pain Details: 22 G needle  Arthrogram: No  Medications: 3 mL lidocaine 1 %; 3 mL bupivacaine 0.5 %; 40 mg methylPREDNISolone acetate 40 MG/ML Outcome: tolerated well, no immediate complications Consent was given by the patient. Patient was prepped and draped in the usual sterile fashion.       Clinical Data: No additional findings.   Subjective: Chief Complaint  Patient presents with  . Right Shoulder - Pain    Kaylee Cardenas is a 68 year old female comes in for evaluation of right shoulder pain for months.  She does a lot of yard work.  This has been going on since 1 August.  She has been using Aleve and Biofreeze which helps partially.  She has pain when she raises her arm above the head.  She is right-hand dominant.  Denies any radicular symptoms.  Denies any injuries.   Review of Systems  Constitutional: Negative.   HENT: Negative.   Eyes: Negative.   Respiratory: Negative.   Cardiovascular: Negative.    Endocrine: Negative.   Musculoskeletal: Negative.   Neurological: Negative.   Hematological: Negative.   Psychiatric/Behavioral: Negative.   All other systems reviewed and are negative.    Objective: Vital Signs: There were no vitals taken for this visit.  Physical Exam Vitals signs and nursing note reviewed.  Constitutional:      Appearance: She is well-developed.  HENT:     Head: Normocephalic and atraumatic.  Neck:     Musculoskeletal: Neck supple.  Pulmonary:     Effort: Pulmonary effort is normal.  Abdominal:     Palpations: Abdomen is soft.  Skin:    General: Skin is warm.     Capillary Refill: Capillary refill takes less than 2 seconds.  Neurological:     Mental Status: She is alert and oriented to person, place, and time.  Psychiatric:        Behavior: Behavior normal.        Thought Content: Thought content normal.        Judgment: Judgment normal.     Ortho Exam Right shoulder exam shows full passive and active range of motion with mild to moderate pain at the extremes of range of motion.  She has some moderate pain with manual muscle testing of the rotator cuff.  The rotator cuff is  intact.  There is no shrug compensation.  Negative jerk test.  Positive Speed test.  Negative impingement.  Increased pain with lowering the arm. Specialty Comments:  No specialty comments available.  Imaging: Xr Shoulder Right  Result Date: 08/06/2019 No acute or structural abnormalities    PMFS History: Patient Active Problem List   Diagnosis Date Noted  . Pain radiating to lower abdomen 03/21/2019  . Routine general medical examination at a health care facility 05/01/2018  . Osteopenia 04/27/2017  . Prediabetes 04/27/2017  . Essential hypertension 02/03/2016   Past Medical History:  Diagnosis Date  . Abnormal mammogram   . Colon polyps   . Dizzy spells   . Headache, migraine   . Kidney stones   . Migraine   . Osteopenia   . Osteoporosis     Family  History  Problem Relation Age of Onset  . Arthritis Mother   . Cancer Mother        colon  . Kidney disease Mother   . Diabetes Mother   . Diabetes Sister   . Hypertension Sister   . Kidney disease Brother   . Diabetes Brother   . Hypertension Brother   . Mental illness Brother   . Cancer Son        prostate  . Breast cancer Neg Hx     Past Surgical History:  Procedure Laterality Date  . ABDOMINAL HYSTERECTOMY     Social History   Occupational History  . Not on file  Tobacco Use  . Smoking status: Never Smoker  . Smokeless tobacco: Never Used  Substance and Sexual Activity  . Alcohol use: No  . Drug use: Not on file  . Sexual activity: Not on file

## 2019-08-08 DIAGNOSIS — Z23 Encounter for immunization: Secondary | ICD-10-CM | POA: Diagnosis not present

## 2019-08-29 ENCOUNTER — Other Ambulatory Visit: Payer: Self-pay

## 2019-08-29 ENCOUNTER — Ambulatory Visit
Admission: RE | Admit: 2019-08-29 | Discharge: 2019-08-29 | Disposition: A | Discharge status: Home / Self Care | Payer: Medicare Other | Source: Ambulatory Visit | Attending: Internal Medicine | Admitting: Internal Medicine

## 2019-08-29 DIAGNOSIS — Z1231 Encounter for screening mammogram for malignant neoplasm of breast: Secondary | ICD-10-CM

## 2019-12-28 ENCOUNTER — Ambulatory Visit: Payer: Medicare Other | Attending: Internal Medicine

## 2019-12-28 DIAGNOSIS — Z23 Encounter for immunization: Secondary | ICD-10-CM | POA: Insufficient documentation

## 2019-12-28 NOTE — Progress Notes (Signed)
   Covid-19 Vaccination Clinic  Name:  Kaylee Cardenas    MRN: [id number] DOB: Mar 04, 1951  12/28/2019  [name] was observed post Covid-19 immunization for 15 minutes without incidence. She was provided with Vaccine Information Sheet and instruction to access the V-Safe system.   [name] was instructed to call 911 with any severe reactions post vaccine: Marland Kitchen Difficulty breathing  . Swelling of your face and throat  . A fast heartbeat  . A bad rash all over your body  . Dizziness and weakness    Immunizations Administered    Name Date Dose VIS Date Route   Pfizer COVID-19 Vaccine 12/28/2019  1:29 PM 0.3 mL 10/18/2019 Intramuscular   Manufacturer: [employer], [employer]   Lot: [id number]   NDC: [address]-7

## 2020-01-21 ENCOUNTER — Ambulatory Visit: Payer: Medicare Other | Attending: Internal Medicine

## 2020-01-21 DIAGNOSIS — Z23 Encounter for immunization: Secondary | ICD-10-CM

## 2020-01-21 NOTE — Progress Notes (Signed)
   Covid-19 Vaccination Clinic  Name:  Kaylee Cardenas    MRN: [id number] DOB: December 13, 1950  01/21/2020  [name] was observed post Covid-19 immunization for 15 minutes without incident. She was provided with Vaccine Information Sheet and instruction to access the V-Safe system.   [name] was instructed to call 911 with any severe reactions post vaccine: Marland Kitchen Difficulty breathing  . Swelling of face and throat  . A fast heartbeat  . A bad rash all over body  . Dizziness and weakness   Immunizations Administered    Name Date Dose VIS Date Route   Pfizer COVID-19 Vaccine 01/21/2020 12:56 PM 0.3 mL 10/18/2019 Intramuscular   Manufacturer: [employer], [employer]   Lot: [id number]   NDC: [address]-8

## 2020-06-17 DIAGNOSIS — H52203 Unspecified astigmatism, bilateral: Secondary | ICD-10-CM | POA: Diagnosis not present

## 2020-06-17 DIAGNOSIS — H524 Presbyopia: Secondary | ICD-10-CM | POA: Diagnosis not present

## 2020-06-17 DIAGNOSIS — H25813 Combined forms of age-related cataract, bilateral: Secondary | ICD-10-CM | POA: Diagnosis not present

## 2020-06-17 DIAGNOSIS — H5203 Hypermetropia, bilateral: Secondary | ICD-10-CM | POA: Diagnosis not present

## 2020-07-20 ENCOUNTER — Other Ambulatory Visit: Payer: Self-pay | Admitting: Internal Medicine

## 2020-07-20 DIAGNOSIS — Z1231 Encounter for screening mammogram for malignant neoplasm of breast: Secondary | ICD-10-CM

## 2020-08-31 ENCOUNTER — Other Ambulatory Visit: Payer: Self-pay

## 2020-08-31 ENCOUNTER — Ambulatory Visit
Admission: RE | Admit: 2020-08-31 | Discharge: 2020-08-31 | Disposition: A | Discharge status: Home / Self Care | Payer: Medicare Other | Source: Ambulatory Visit | Attending: Internal Medicine | Admitting: Internal Medicine

## 2020-08-31 DIAGNOSIS — Z1231 Encounter for screening mammogram for malignant neoplasm of breast: Secondary | ICD-10-CM | POA: Diagnosis not present

## 2020-09-02 ENCOUNTER — Other Ambulatory Visit: Payer: Self-pay | Admitting: Internal Medicine

## 2020-09-02 DIAGNOSIS — R928 Other abnormal and inconclusive findings on diagnostic imaging of breast: Secondary | ICD-10-CM

## 2020-09-05 ENCOUNTER — Other Ambulatory Visit: Payer: Self-pay

## 2020-09-05 ENCOUNTER — Ambulatory Visit: Payer: Medicare Other | Attending: Internal Medicine

## 2020-09-05 DIAGNOSIS — Z23 Encounter for immunization: Secondary | ICD-10-CM

## 2020-09-05 NOTE — Progress Notes (Signed)
   Covid-19 Vaccination Clinic  Name:  Perina Salvaggio    MRN: [id number] DOB: 1950-12-16  09/05/2020  Ms. Rashid was observed post Covid-19 immunization for 15 minutes without incident. She was provided with Vaccine Information Sheet and instruction to access the V-Safe system.   Ms. Meneely was instructed to call 911 with any severe reactions post vaccine: Marland Kitchen Difficulty breathing  . Swelling of face and throat  . A fast heartbeat  . A bad rash all over body  . Dizziness and weakness

## 2020-09-18 ENCOUNTER — Ambulatory Visit
Admission: RE | Admit: 2020-09-18 | Discharge: 2020-09-18 | Disposition: A | Discharge status: Home / Self Care | Payer: Medicare Other | Source: Ambulatory Visit | Attending: Internal Medicine | Admitting: Internal Medicine

## 2020-09-18 ENCOUNTER — Other Ambulatory Visit: Payer: Self-pay

## 2020-09-18 DIAGNOSIS — R928 Other abnormal and inconclusive findings on diagnostic imaging of breast: Secondary | ICD-10-CM

## 2020-09-18 DIAGNOSIS — N6489 Other specified disorders of breast: Secondary | ICD-10-CM | POA: Diagnosis not present

## 2020-09-28 ENCOUNTER — Telehealth: Payer: Self-pay | Admitting: Internal Medicine

## 2020-09-28 ENCOUNTER — Encounter: Payer: Medicare Other | Admitting: Internal Medicine

## 2020-09-28 NOTE — Telephone Encounter (Signed)
Fine with me

## 2020-09-28 NOTE — Telephone Encounter (Signed)
Patient wondering if it was okay to do a transfer of care from Dr. Okey Dupre to Dr. Yetta Barre. Let me know, Thank you.

## 2020-09-29 NOTE — Telephone Encounter (Signed)
Left a voicemail for patient to make a TOC appointment with Dr. Yetta Barre

## 2020-10-14 ENCOUNTER — Encounter: Payer: Self-pay | Admitting: Internal Medicine

## 2020-10-14 ENCOUNTER — Ambulatory Visit (INDEPENDENT_AMBULATORY_CARE_PROVIDER_SITE_OTHER): Payer: Medicare Other | Admitting: Internal Medicine

## 2020-10-14 ENCOUNTER — Other Ambulatory Visit: Payer: Self-pay

## 2020-10-14 VITALS — BP 180/102 | HR 52 | Temp 98.2°F | Resp 16 | Ht 65.5 in | Wt 167.0 lb

## 2020-10-14 DIAGNOSIS — R002 Palpitations: Secondary | ICD-10-CM | POA: Insufficient documentation

## 2020-10-14 DIAGNOSIS — E785 Hyperlipidemia, unspecified: Secondary | ICD-10-CM | POA: Diagnosis not present

## 2020-10-14 DIAGNOSIS — M8580 Other specified disorders of bone density and structure, unspecified site: Secondary | ICD-10-CM

## 2020-10-14 DIAGNOSIS — I10 Essential (primary) hypertension: Secondary | ICD-10-CM | POA: Diagnosis not present

## 2020-10-14 DIAGNOSIS — Z0000 Encounter for general adult medical examination without abnormal findings: Secondary | ICD-10-CM | POA: Diagnosis not present

## 2020-10-14 DIAGNOSIS — R7303 Prediabetes: Secondary | ICD-10-CM | POA: Diagnosis not present

## 2020-10-14 DIAGNOSIS — R001 Bradycardia, unspecified: Secondary | ICD-10-CM | POA: Insufficient documentation

## 2020-10-14 DIAGNOSIS — Z0001 Encounter for general adult medical examination with abnormal findings: Secondary | ICD-10-CM | POA: Insufficient documentation

## 2020-10-14 LAB — BASIC METABOLIC PANEL
BUN: 11 mg/dL (ref 6–23)
CO2: 33 mEq/L — ABNORMAL HIGH (ref 19–32)
Calcium: 9.1 mg/dL (ref 8.4–10.5)
Chloride: 105 mEq/L (ref 96–112)
Creatinine, Ser: 0.93 mg/dL (ref 0.40–1.20)
GFR: 62.11 mL/min (ref >60.00)
Glucose, Bld: 87 mg/dL (ref 70–99)
Potassium: 3.7 mEq/L (ref 3.5–5.1)
Sodium: 141 mEq/L (ref 135–145)

## 2020-10-14 LAB — LIPID PANEL
Cholesterol: 186 mg/dL (ref 0–200)
HDL: 81.8 mg/dL (ref >39.00)
LDL Cholesterol: 92 mg/dL (ref 0–99)
NonHDL: 104.63
Total CHOL/HDL Ratio: 2
Triglycerides: 62 mg/dL (ref 0.0–149.0)
VLDL: 12.4 mg/dL (ref 0.0–40.0)

## 2020-10-14 LAB — TSH: TSH: 2.01 u[IU]/mL (ref 0.35–4.50)

## 2020-10-14 LAB — CBC WITH DIFFERENTIAL/PLATELET
Basophils Absolute: 0.1 10*3/uL (ref 0.0–0.1)
Basophils Relative: 1.2 % (ref 0.0–3.0)
Eosinophils Absolute: 0.3 10*3/uL (ref 0.0–0.7)
Eosinophils Relative: 5.5 % — ABNORMAL HIGH (ref 0.0–5.0)
HCT: 43.1 % (ref 36.0–46.0)
Hemoglobin: 14 g/dL (ref 12.0–15.0)
Lymphocytes Relative: 48.5 % — ABNORMAL HIGH (ref 12.0–46.0)
Lymphs Abs: 2.3 10*3/uL (ref 0.7–4.0)
MCHC: 32.5 g/dL (ref 30.0–36.0)
MCV: 89.2 fl (ref 78.0–100.0)
Monocytes Absolute: 0.4 10*3/uL (ref 0.1–1.0)
Monocytes Relative: 9.2 % (ref 3.0–12.0)
Neutro Abs: 1.7 10*3/uL (ref 1.4–7.7)
Neutrophils Relative %: 35.6 % — ABNORMAL LOW (ref 43.0–77.0)
Platelets: 152 10*3/uL (ref 150.0–400.0)
RBC: 4.83 Mil/uL (ref 3.87–5.11)
RDW: 14.5 % (ref 11.5–15.5)
WBC: 4.8 10*3/uL (ref 4.0–10.5)

## 2020-10-14 LAB — HEPATIC FUNCTION PANEL
ALT: 10 U/L (ref 0–35)
AST: 15 U/L (ref 0–37)
Albumin: 3.9 g/dL (ref 3.5–5.2)
Alkaline Phosphatase: 73 U/L (ref 39–117)
Bilirubin, Direct: 0.1 mg/dL (ref 0.0–0.3)
Total Bilirubin: 0.5 mg/dL (ref 0.2–1.2)
Total Protein: 6.9 g/dL (ref 6.0–8.3)

## 2020-10-14 LAB — URINALYSIS, ROUTINE W REFLEX MICROSCOPIC
Bilirubin Urine: NEGATIVE
Hgb urine dipstick: NEGATIVE
Ketones, ur: NEGATIVE
Nitrite: NEGATIVE
Specific Gravity, Urine: 1.02 (ref 1.000–1.030)
Total Protein, Urine: NEGATIVE
Urine Glucose: NEGATIVE
Urobilinogen, UA: 0.2 (ref 0.0–1.0)
pH: 7 (ref 5.0–8.0)

## 2020-10-14 LAB — VITAMIN D 25 HYDROXY (VIT D DEFICIENCY, FRACTURES): VITD: 53.15 ng/mL (ref 30.00–100.00)

## 2020-10-14 LAB — HEMOGLOBIN A1C: Hgb A1c MFr Bld: 5.5 % (ref 4.6–6.5)

## 2020-10-14 MED ORDER — LIVALO 1 MG PO TABS: 1.0000 | ORAL_TABLET | Freq: Every day | ORAL | 1 refills | Status: DC

## 2020-10-14 MED ORDER — TRIAMTERENE-HCTZ 37.5-25 MG PO CAPS: 1.0000 | ORAL_CAPSULE | Freq: Every day | ORAL | 0 refills | Status: DC

## 2020-10-14 NOTE — Progress Notes (Signed)
Subjective:  Patient ID: Kaylee Cardenas, female    DOB: [birth year]  Age: 69 y.o. MRN: [id number]  CC: Annual Exam, Hypertension, and Hyperlipidemia  This visit occurred during the SARS-CoV-2 public health emergency.  Safety protocols were in place, including screening questions prior to the visit, additional usage of staff PPE, and extensive cleaning of exam room while observing appropriate contact time as indicated for disinfecting solutions.    HPI Kaylee Cardenas presents for a CPX.  She had an episode of palpitations about 2 months ago.  She started she felt like her heart rate was briefly elevated and she was short of breath.  She has had no more plapitations since then.  She has a history of hypertension but is not currently taking an antihypertensive.  She tried amlodipine but it caused lower extremity swelling.  She has had a few headaches recently.  She denies blurred vision, chest pain, DOE, edema, or fatigue.  History Kaylee Cardenas has a past medical history of Abnormal mammogram, Colon polyps, Dizzy spells, Headache, migraine, Kidney stones, Migraine, Osteopenia, and Osteoporosis.   She has a past surgical history that includes Abdominal hysterectomy.   Her family history includes Arthritis in her mother; Breast cancer (age of onset: 35) in her sister; Cancer in her mother and son; Diabetes in her brother, mother, and sister; Hypertension in her brother and sister; Kidney disease in her brother and mother; Mental illness in her brother.She reports that she has never smoked. She has never used smokeless tobacco. She reports that she does not drink alcohol. No history on file for drug use.  Outpatient Medications Prior to Visit  Medication Sig Dispense Refill  . Bioflavonoid Products (ESTER C PO) Take by mouth.    . Biotin 5000 MCG CAPS Take by mouth.    . cyanocobalamin 100 MCG tablet Take 100 mcg by mouth daily.    . fish oil-omega-3 fatty acids 1000 MG capsule Take 2 g by mouth  daily.    . Lactobacillus (PROBIOTIC ACIDOPHILUS PO) Take by mouth.    . Multiple Vitamins-Minerals (MULTIVITAMIN WITH MINERALS) tablet Take 1 tablet by mouth daily.    . Calcium Carb-Cholecalciferol (CALCIUM 600 + D PO) Take by mouth.    Marland Kitchen amLODipine (NORVASC) 5 MG tablet TAKE 1 TABLET BY MOUTH DAILY. 90 tablet 1  . tiZANidine (ZANAFLEX) 4 MG tablet Take 1 tablet (4 mg total) by mouth every 6 (six) hours as needed for muscle spasms (pain). 30 tablet 0   No facility-administered medications prior to visit.    ROS Review of Systems  Constitutional: Negative for diaphoresis, fatigue and unexpected weight change.  HENT: Negative.   Eyes: Negative for visual disturbance.  Respiratory: Negative for cough, chest tightness, shortness of breath and wheezing.   Cardiovascular: Positive for palpitations. Negative for chest pain and leg swelling.  Gastrointestinal: Negative for abdominal pain, constipation, diarrhea, nausea and vomiting.  Endocrine: Negative.   Genitourinary: Negative.  Negative for difficulty urinating.  Musculoskeletal: Negative for arthralgias, myalgias and neck pain.  Skin: Negative.  Negative for color change and pallor.  Neurological: Positive for headaches. Negative for dizziness, tremors, syncope, facial asymmetry, speech difficulty, weakness and light-headedness.  Hematological: Negative for adenopathy. Does not bruise/bleed easily.  Psychiatric/Behavioral: Negative.  Negative for dysphoric mood, hallucinations and sleep disturbance. The patient is not nervous/anxious.     Objective:  BP (!) 180/102   Pulse (!) 52   Temp 98.2 F (36.8 C) (Oral)   Resp 16   Ht  5' 5.5" (1.664 m)   Wt 167 lb (75.8 kg)   SpO2 96%   BMI 27.37 kg/m   Physical Exam Vitals reviewed.  Constitutional:      Appearance: Normal appearance.  HENT:     Nose: Nose normal.     Mouth/Throat:     Mouth: Mucous membranes are moist.  Eyes:     General: No scleral icterus.     Conjunctiva/sclera: Conjunctivae normal.  Cardiovascular:     Rate and Rhythm: Regular rhythm. Bradycardia present.     Heart sounds: Normal heart sounds, S1 normal and S2 normal. No murmur heard. No friction rub. No gallop.      Comments: EKG- Sinus bradycardia, 54 bpm No LVH Otherwise normal EKG Pulmonary:     Effort: Pulmonary effort is normal.     Breath sounds: No stridor. No wheezing, rhonchi or rales.  Abdominal:     General: Abdomen is flat.     Palpations: There is no mass.     Tenderness: There is no abdominal tenderness. There is no guarding.  Musculoskeletal:     Cervical back: Neck supple.     Right lower leg: No edema.     Left lower leg: No edema.  Lymphadenopathy:     Cervical: No cervical adenopathy.  Skin:    General: Skin is warm and dry.     Coloration: Skin is not pale.  Neurological:     General: No focal deficit present.     Mental Status: She is alert and oriented to person, place, and time. Mental status is at baseline.  Psychiatric:        Mood and Affect: Mood normal.        Behavior: Behavior normal.     Lab Results  Component Value Date   WBC 4.8 10/14/2020   HGB 14.0 10/14/2020   HCT 43.1 10/14/2020   PLT 152.0 10/14/2020   GLUCOSE 87 10/14/2020   CHOL 186 10/14/2020   TRIG 62.0 10/14/2020   HDL 81.80 10/14/2020   LDLCALC 92 10/14/2020   ALT 10 10/14/2020   AST 15 10/14/2020   NA 141 10/14/2020   K 3.7 10/14/2020   CL 105 10/14/2020   CREATININE 0.93 10/14/2020   BUN 11 10/14/2020   CO2 33 (H) 10/14/2020   TSH 2.01 10/14/2020   HGBA1C 5.5 10/14/2020    Assessment & Plan:   Kaylee Cardenas was seen today for annual exam, hypertension and hyperlipidemia.  Diagnoses and all orders for this visit:  Encounter for general adult medical examination with abnormal findings- Exam completed, labs reviewed, vaccines reviewed and updated, cancer screenings are up-to-date, patient education was given.  Essential hypertension- Her blood pressure is  not adequately well controlled and she is symptomatic.  Her EKG is negative for LVH or ischemia.  I recommended that she treat this with a combination of triamterene and hydrochlorothiazide. -     CBC with Differential/Platelet; Future -     Basic metabolic panel; Future -     Urinalysis, Routine w reflex microscopic; Future -     Urinalysis, Routine w reflex microscopic -     Basic metabolic panel -     CBC with Differential/Platelet -     triamterene-hydrochlorothiazide (DYAZIDE) 37.5-25 MG capsule; Take 1 each (1 capsule total) by mouth daily.  Osteopenia, unspecified location- Her vitamin D level is normal. -     VITAMIN D 25 Hydroxy (Vit-D Deficiency, Fractures); Future -     VITAMIN D 25 Hydroxy (  Vit-D Deficiency, Fractures)  Prediabetes- Her A1c is normal at 5.5%. -     Basic metabolic panel; Future -     Hemoglobin A1c; Future -     Hemoglobin A1c -     Basic metabolic panel  Hyperlipidemia LDL goal <130- She has a mildly elevated ASCVD risk score so I have asked her to take a statin for CV risk reduction. -     Lipid panel; Future -     Hepatic function panel; Future -     TSH; Future -     TSH -     Hepatic function panel -     Lipid panel -     Pitavastatin Calcium (LIVALO) 1 MG TABS; Take 1 tablet (1 mg total) by mouth daily.  Palpitations- I have asked him to undergo an event monitor to see if she has a dysrhythmia. -     EKG 12-Lead -     CARDIAC EVENT MONITOR; Future  Bradycardia- I have asked her to undergo an event monitor to see if she has pauses or would benefit from a pacemaker. -     CARDIAC EVENT MONITOR; Future   I have discontinued Atiyah L. Aguon's Calcium Carb-Cholecalciferol (CALCIUM 600 + D PO), amLODipine, and tiZANidine. I am also having her start on triamterene-hydrochlorothiazide and Livalo. Additionally, I am having her maintain her multivitamin with minerals, fish oil-omega-3 fatty acids, cyanocobalamin, Biotin, Bioflavonoid Products (ESTER C PO),  and Lactobacillus (PROBIOTIC ACIDOPHILUS PO).  Meds ordered this encounter  Medications  . triamterene-hydrochlorothiazide (DYAZIDE) 37.5-25 MG capsule    Sig: Take 1 each (1 capsule total) by mouth daily.    Dispense:  90 capsule    Refill:  0  . Pitavastatin Calcium (LIVALO) 1 MG TABS    Sig: Take 1 tablet (1 mg total) by mouth daily.    Dispense:  90 tablet    Refill:  1   In addition to time spent on CPE, I spent 50 minutes in preparing to see the patient by review of recent labs, imaging and procedures, obtaining and reviewing separately obtained history, communicating with the patient and family or caregiver, ordering medications, tests or procedures, and documenting clinical information in the EHR including the differential Dx, treatment, and any further evaluation and other management of 1. Essential hypertension 2. Osteopenia, unspecified location 3. Prediabetes 4. Hyperlipidemia LDL goal <130 5. Palpitations 6. Bradycardia     Follow-up: Return in about 6 weeks (around 11/25/2020).  Sanda Linger, MD

## 2020-10-14 NOTE — Patient Instructions (Signed)

## 2020-10-23 NOTE — Addendum Note (Signed)
Addended by: Merrilyn Puma on: 10/23/2020 04:54 PM   Modules accepted: Orders

## 2020-11-02 ENCOUNTER — Encounter: Payer: Self-pay | Admitting: Internal Medicine

## 2020-11-03 DIAGNOSIS — Z03818 Encounter for observation for suspected exposure to other biological agents ruled out: Secondary | ICD-10-CM | POA: Diagnosis not present

## 2020-11-05 ENCOUNTER — Ambulatory Visit (INDEPENDENT_AMBULATORY_CARE_PROVIDER_SITE_OTHER): Payer: Medicare Other

## 2020-11-05 DIAGNOSIS — R001 Bradycardia, unspecified: Secondary | ICD-10-CM

## 2020-11-05 DIAGNOSIS — R002 Palpitations: Secondary | ICD-10-CM

## 2020-12-07 ENCOUNTER — Telehealth: Payer: Self-pay | Admitting: Internal Medicine

## 2020-12-07 NOTE — Telephone Encounter (Signed)
LVM for pt to rtn my call to schedule awv with nha. Please schedule awv with nha if pt calls the office  

## 2020-12-09 NOTE — Progress Notes (Signed)
Patient viewed results via mychart on 12/08/2020 at 2:02 pm

## 2020-12-14 DIAGNOSIS — Z03818 Encounter for observation for suspected exposure to other biological agents ruled out: Secondary | ICD-10-CM | POA: Diagnosis not present

## 2020-12-14 DIAGNOSIS — Z20822 Contact with and (suspected) exposure to covid-19: Secondary | ICD-10-CM | POA: Diagnosis not present

## 2020-12-15 ENCOUNTER — Ambulatory Visit (INDEPENDENT_AMBULATORY_CARE_PROVIDER_SITE_OTHER): Payer: Medicare Other

## 2020-12-15 DIAGNOSIS — Z0000 Encounter for general adult medical examination without abnormal findings: Secondary | ICD-10-CM

## 2020-12-15 NOTE — Patient Instructions (Addendum)
Kaylee Cardenas , Thank you for taking time to come for your Medicare Wellness Visit. I appreciate your ongoing commitment to your health goals. Please review the following plan we discussed and let me know if I can assist you in the future.   Screening recommendations/referrals: Colonoscopy: 08/03/2017; due every 5 years Mammogram: 09/18/2020; due every year Bone Density: 05/07/2019; due every 2 years Recommended yearly ophthalmology/optometry visit for glaucoma screening and checkup Recommended yearly dental visit for hygiene and checkup  Vaccinations: Influenza vaccine: 08/07/2020 Pneumococcal vaccine: 09/07/2016, 05/01/2018 Tdap vaccine: 08/22/2015; due every 10 years Shingles vaccine: no record   Covid-19: 12/28/2019, 01/21/2020, 09/05/2020  Advanced directives: Please bring a copy of your health care power of attorney and living will to the office at your convenience.  Conditions/risks identified: Yes; Reviewed health maintenance screenings with patient today and relevant education, vaccines, and/or referrals were provided. Please continue to do your personal lifestyle choices by: daily care of teeth and gums, regular physical activity (goal should be 5 days a week for 30 minutes), eat a healthy diet, avoid tobacco and drug use, limiting any alcohol intake, taking a low-dose aspirin (if not allergic or have been advised by your provider otherwise) and taking vitamins and minerals as recommended by your provider. Continue doing brain stimulating activities (puzzles, reading, adult coloring books, staying active) to keep memory sharp. Continue to eat heart healthy diet (full of fruits, vegetables, whole grains, lean protein, water--limit salt, fat, and sugar intake) and increase physical activity as tolerated.  Next appointment: Scheduled for 2/29/2023 at 1:15pm.   Preventive Care 65 Years and Older, Female Preventive care refers to lifestyle choices and visits with your health care provider that  can promote health and wellness. What does preventive care include?  A yearly physical exam. This is also called an annual well check.  Dental exams once or twice a year.  Routine eye exams. Ask your health care provider how often you should have your eyes checked.  Personal lifestyle choices, including:  Daily care of your teeth and gums.  Regular physical activity.  Eating a healthy diet.  Avoiding tobacco and drug use.  Limiting alcohol use.  Practicing safe sex.  Taking low-dose aspirin every day.  Taking vitamin and mineral supplements as recommended by your health care provider. What happens during an annual well check? The services and screenings done by your health care provider during your annual well check will depend on your age, overall health, lifestyle risk factors, and family history of disease. Counseling  Your health care provider may ask you questions about your:  Alcohol use.  Tobacco use.  Drug use.  Emotional well-being.  Home and relationship well-being.  Sexual activity.  Eating habits.  History of falls.  Memory and ability to understand (cognition).  Work and work Astronomer.  Reproductive health. Screening  You may have the following tests or measurements:  Height, weight, and BMI.  Blood pressure.  Lipid and cholesterol levels. These may be checked every 5 years, or more frequently if you are over 20 years old.  Skin check.  Lung cancer screening. You may have this screening every year starting at age 6 if you have a 30-pack-year history of smoking and currently smoke or have quit within the past 15 years.  Fecal occult blood test (FOBT) of the stool. You may have this test every year starting at age 22.  Flexible sigmoidoscopy or colonoscopy. You may have a sigmoidoscopy every 5 years or a colonoscopy every 10 years  starting at age 7.  Hepatitis C blood test.  Hepatitis B blood test.  Sexually transmitted disease  (STD) testing.  Diabetes screening. This is done by checking your blood sugar (glucose) after you have not eaten for a while (fasting). You may have this done every 1-3 years.  Bone density scan. This is done to screen for osteoporosis. You may have this done starting at age 74.  Mammogram. This may be done every 1-2 years. Talk to your health care provider about how often you should have regular mammograms. Talk with your health care provider about your test results, treatment options, and if necessary, the need for more tests. Vaccines  Your health care provider may recommend certain vaccines, such as:  Influenza vaccine. This is recommended every year.  Tetanus, diphtheria, and acellular pertussis (Tdap, Td) vaccine. You may need a Td booster every 10 years.  Zoster vaccine. You may need this after age 20.  Pneumococcal 13-valent conjugate (PCV13) vaccine. One dose is recommended after age 71.  Pneumococcal polysaccharide (PPSV23) vaccine. One dose is recommended after age 100. Talk to your health care provider about which screenings and vaccines you need and how often you need them. This information is not intended to replace advice given to you by your health care provider. Make sure you discuss any questions you have with your health care provider. Document Released: 11/20/2015 Document Revised: 07/13/2016 Document Reviewed: 08/25/2015 Elsevier Interactive Patient Education  2017 [place] Prevention in the Home Falls can cause injuries. They can happen to people of all ages. There are many things you can do to make your home safe and to help prevent falls. What can I do on the outside of my home?  Regularly fix the edges of walkways and driveways and fix any cracks.  Remove anything that might make you trip as you walk through a door, such as a raised step or threshold.  Trim any bushes or trees on the path to your home.  Use bright outdoor lighting.  Clear any  walking paths of anything that might make someone trip, such as rocks or tools.  Regularly check to see if handrails are loose or broken. Make sure that both sides of any steps have handrails.  Any raised decks and porches should have guardrails on the edges.  Have any leaves, snow, or ice cleared regularly.  Use sand or salt on walking paths during winter.  Clean up any spills in your garage right away. This includes oil or grease spills. What can I do in the bathroom?  Use night lights.  Install grab bars by the toilet and in the tub and shower. Do not use towel bars as grab bars.  Use non-skid mats or decals in the tub or shower.  If you need to sit down in the shower, use a plastic, non-slip stool.  Keep the floor dry. Clean up any water that spills on the floor as soon as it happens.  Remove soap buildup in the tub or shower regularly.  Attach bath mats securely with double-sided non-slip rug tape.  Do not have throw rugs and other things on the floor that can make you trip. What can I do in the bedroom?  Use night lights.  Make sure that you have a light by your bed that is easy to reach.  Do not use any sheets or blankets that are too big for your bed. They should not hang down onto the floor.  Have a  firm chair that has side arms. You can use this for support while you get dressed.  Do not have throw rugs and other things on the floor that can make you trip. What can I do in the kitchen?  Clean up any spills right away.  Avoid walking on wet floors.  Keep items that you use a lot in easy-to-reach places.  If you need to reach something above you, use a strong step stool that has a grab bar.  Keep electrical cords out of the way.  Do not use floor polish or wax that makes floors slippery. If you must use wax, use non-skid floor wax.  Do not have throw rugs and other things on the floor that can make you trip. What can I do with my stairs?  Do not leave  any items on the stairs.  Make sure that there are handrails on both sides of the stairs and use them. Fix handrails that are broken or loose. Make sure that handrails are as long as the stairways.  Check any carpeting to make sure that it is firmly attached to the stairs. Fix any carpet that is loose or worn.  Avoid having throw rugs at the top or bottom of the stairs. If you do have throw rugs, attach them to the floor with carpet tape.  Make sure that you have a light switch at the top of the stairs and the bottom of the stairs. If you do not have them, ask someone to add them for you. What else can I do to help prevent falls?  Wear shoes that:  Do not have high heels.  Have rubber bottoms.  Are comfortable and fit you well.  Are closed at the toe. Do not wear sandals.  If you use a stepladder:  Make sure that it is fully opened. Do not climb a closed stepladder.  Make sure that both sides of the stepladder are locked into place.  Ask someone to hold it for you, if possible.  Clearly mark and make sure that you can see:  Any grab bars or handrails.  First and last steps.  Where the edge of each step is.  Use tools that help you move around (mobility aids) if they are needed. These include:  Canes.  Walkers.  Scooters.  Crutches.  Turn on the lights when you go into a dark area. Replace any light bulbs as soon as they burn out.  Set up your furniture so you have a clear path. Avoid moving your furniture around.  If any of your floors are uneven, fix them.  If there are any pets around you, be aware of where they are.  Review your medicines with your doctor. Some medicines can make you feel dizzy. This can increase your chance of falling. Ask your doctor what other things that you can do to help prevent falls. This information is not intended to replace advice given to you by your health care provider. Make sure you discuss any questions you have with your  health care provider. Document Released: 08/20/2009 Document Revised: 03/31/2016 Document Reviewed: 11/28/2014 Elsevier Interactive Patient Education  2017 [employer].

## 2020-12-15 NOTE — Progress Notes (Signed)
Subjective:   Kaylee Cardenas is a 70 y.o. female who presents for Medicare Annual (Subsequent) preventive examination.  Review of Systems    No ROS. Medicare Wellness Visit. Additional risk factors are reflected in social history. Cardiac Risk Factors include: advanced age (>22men, >45 women);dyslipidemia;hypertension;family history of premature cardiovascular disease     Objective:    There were no vitals filed for this visit. There is no height or weight on file to calculate BMI.  Advanced Directives 12/15/2020  Does Patient Have a Medical Advance Directive? Yes  Type of Advance Directive Living will;Healthcare Power of Attorney  Does patient want to make changes to medical advance directive? No - Patient declined  Copy of Healthcare Power of Attorney in Chart? No - copy requested    Current Medications (verified) Outpatient Encounter Medications as of 12/15/2020  Medication Sig  . Bioflavonoid Products (ESTER C PO) Take by mouth.  . Biotin 5000 MCG CAPS Take by mouth.  . cyanocobalamin 100 MCG tablet Take 100 mcg by mouth daily.  . fish oil-omega-3 fatty acids 1000 MG capsule Take 2 g by mouth daily.  . Lactobacillus (PROBIOTIC ACIDOPHILUS PO) Take by mouth.  . Multiple Vitamins-Minerals (MULTIVITAMIN WITH MINERALS) tablet Take 1 tablet by mouth daily.  . Pitavastatin Calcium (LIVALO) 1 MG TABS Take 1 tablet (1 mg total) by mouth daily.  Marland Kitchen triamterene-hydrochlorothiazide (DYAZIDE) 37.5-25 MG capsule Take 1 each (1 capsule total) by mouth daily. (Patient not taking: Reported on 12/15/2020)   No facility-administered encounter medications on file as of 12/15/2020.    Allergies (verified) Amlodipine   History: Past Medical History:  Diagnosis Date  . Abnormal mammogram   . Colon polyps   . Dizzy spells   . Headache, migraine   . Kidney stones   . Migraine   . Osteopenia   . Osteoporosis    Past Surgical History:  Procedure Laterality Date  . ABDOMINAL HYSTERECTOMY      Family History  Problem Relation Age of Onset  . Arthritis Mother   . Cancer Mother        colon  . Kidney disease Mother   . Diabetes Mother   . Diabetes Sister   . Hypertension Sister   . Breast cancer Sister 83  . Kidney disease Brother   . Diabetes Brother   . Hypertension Brother   . Mental illness Brother   . Cancer Son        prostate   Social History   Socioeconomic History  . Marital status: Single    Spouse name: Not on file  . Number of children: Not on file  . Years of education: Not on file  . Highest education level: Not on file  Occupational History  . Not on file  Tobacco Use  . Smoking status: Never Smoker  . Smokeless tobacco: Never Used  Substance and Sexual Activity  . Alcohol use: No  . Drug use: Not on file  . Sexual activity: Not on file  Other Topics Concern  . Not on file  Social History Narrative   Exercise: occasional fund raising walks, yard work   Chemical engineer Strain: Low Risk   . Difficulty of Paying Living Expenses: Not hard at all  Food Insecurity: No Food Insecurity  . Worried About Programme researcher, broadcasting/film/video in the Last Year: Never true  . Ran Out of Food in the Last Year: Never true  Transportation Needs: No Transportation Needs  .  Lack of Transportation (Medical): No  . Lack of Transportation (Non-Medical): No  Physical Activity: Sufficiently Active  . Days of Exercise per Week: 5 days  . Minutes of Exercise per Session: 30 min  Stress: No Stress Concern Present  . Feeling of Stress : Not at all  Social Connections: Moderately Integrated  . Frequency of Communication with Friends and Family: More than three times a week  . Frequency of Social Gatherings with Friends and Family: More than three times a week  . Attends Religious Services: More than 4 times per year  . Active Member of Clubs or Organizations: Yes  . Attends Banker Meetings: More than 4 times per year  .  Marital Status: Never married    Tobacco Counseling Counseling given: Not Answered   Clinical Intake:  Pre-visit preparation completed: Yes  Pain : No/denies pain     Nutritional Risks: None Diabetes: No  How often do you need to have someone help you when you read instructions, pamphlets, or other written materials from your doctor or pharmacy?: 1 - Never What is the last grade level you completed in school?: HSG; In Accounting Program at [employer]  Diabetic? no  Interpreter Needed?: No  Information entered by :: Susie Cassette, LPN   Activities of Daily Living In your present state of health, do you have any difficulty performing the following activities: 12/15/2020 10/14/2020  Hearing? N N  Vision? N N  Difficulty concentrating or making decisions? N N  Walking or climbing stairs? N N  Dressing or bathing? N N  Doing errands, shopping? N N  Preparing Food and eating ? N -  Using the Toilet? N -  In the past six months, have you accidently leaked urine? N -  Do you have problems with loss of bowel control? N -  Managing your Medications? N -  Managing your Finances? N -  Housekeeping or managing your Housekeeping? N -  Some recent data might be hidden    Patient Care Team: Myrlene Broker, MD as PCP - General (Internal Medicine)  Indicate any recent Medical Services you may have received from other than [facility] providers in the past year (date may be approximate).     Assessment:   This is a routine wellness examination for [facility].  Hearing/Vision screen No exam data present  Dietary issues and exercise activities discussed: Current Exercise Habits: Home exercise routine, Type of exercise: walking;Other - see comments (5000 k steps daily), Time (Minutes): 30, Frequency (Times/Week): 5, Weekly Exercise (Minutes/Week): 150, Intensity: Moderate, Exercise limited by: cardiac condition(s)  Goals    . Client understands the importance of follow-up with providers  by attending scheduled visits     I want to be able to only take supplements, monitor my blood pressure and increase my water intake.      Depression Screen PHQ 2/9 Scores 12/15/2020 10/14/2020 05/07/2019 05/01/2018 04/27/2017 02/03/2016  PHQ - 2 Score 0 0 0 0 0 0    Fall Risk Fall Risk  12/15/2020 10/14/2020 05/07/2019 05/01/2018 04/27/2017  Falls in the past year? 0 0 0 No No  Number falls in past yr: 0 - - - -  Injury with Fall? 0 - - - -  Risk for fall due to : No Fall Risks - - - -    FALL RISK PREVENTION PERTAINING TO THE HOME:  Any stairs in or around the home? No  If so, are there any without handrails? No  Home free  of loose throw rugs in walkways, pet beds, electrical cords, etc? Yes  Adequate lighting in your home to reduce risk of falls? Yes   ASSISTIVE DEVICES UTILIZED TO PREVENT FALLS:  Life alert? Yes  Use of a cane, walker or w/c? No  Grab bars in the bathroom? No  Shower chair or bench in shower? Yes  Elevated toilet seat or a handicapped toilet? No   TIMED UP AND GO:  Was the test performed? No .  Length of time to ambulate 10 feet: 0 sec.   Gait steady and fast without use of assistive device  Cognitive Function: Normal cognitive status.         Immunizations Immunization History  Administered Date(s) Administered  . Influenza-Unspecified 08/08/2016, 08/07/2018, 08/07/2020  . PFIZER(Purple Top)SARS-COV-2 Vaccination 12/28/2019, 01/21/2020, 09/05/2020  . Pneumococcal Conjugate-13 09/07/2016  . Pneumococcal Polysaccharide-23 05/01/2018  . Tdap 08/22/2015  . Zoster 08/08/2016    TDAP status: Up to date  Flu Vaccine status: Up to date  Pneumococcal vaccine status: Up to date  Covid-19 vaccine status: Completed vaccines  Qualifies for Shingles Vaccine? Yes   Zostavax completed Yes   Shingrix Completed?: No.    Education has been provided regarding the importance of this vaccine. Patient has been advised to call insurance company to determine out of  pocket expense if they have not yet received this vaccine. Advised may also receive vaccine at local pharmacy or Health Dept. Verbalized acceptance and understanding.  Screening Tests Health Maintenance  Topic Date Due  . COLONOSCOPY (Pts 45-55yrs Insurance coverage will need to be confirmed)  08/03/2022  . MAMMOGRAM  08/31/2022  . TETANUS/TDAP  08/21/2025  . INFLUENZA VACCINE  Completed  . DEXA SCAN  Completed  . COVID-19 Vaccine  Completed  . Hepatitis C Screening  Completed  . PNA vac Low Risk Adult  Completed    Health Maintenance  There are no preventive care reminders to display for this patient.  Colorectal cancer screening: Type of screening: Colonoscopy. Completed 08/03/2017. Repeat every 5 years  Mammogram status: Completed 09/18/2020. Repeat every year  Bone Density status: Completed 05/07/2019. Results reflect: Bone density results: NORMAL. Repeat every 2 years.  Lung Cancer Screening: (Low Dose CT Chest recommended if Age 104-80 years, 30 pack-year currently smoking OR have quit w/in 15years.) does not qualify.   Lung Cancer Screening Referral: no  Additional Screening:  Hepatitis C Screening: does qualify; Completed yes  Vision Screening: Recommended annual ophthalmology exams for early detection of glaucoma and other disorders of the eye. Is the patient up to date with their annual eye exam?  Yes  Who is the provider or what is the name of the office in which the patient attends annual eye exams? Jethro Bolus, MD. If pt is not established with a provider, would they like to be referred to a provider to establish care? No .   Dental Screening: Recommended annual dental exams for proper oral hygiene  Community Resource Referral / Chronic Care Management: CRR required this visit?  No   CCM required this visit?  No      Plan:     I have personally reviewed and noted the following in the patient's chart:   . Medical and social history . Use of alcohol,  tobacco or illicit drugs  . Current medications and supplements . Functional ability and status . Nutritional status . Physical activity . Advanced directives . List of other physicians . Hospitalizations, surgeries, and ER visits in previous 12 months .  Vitals . Screenings to include cognitive, depression, and falls . Referrals and appointments  In addition, I have reviewed and discussed with patient certain preventive protocols, quality metrics, and best practice recommendations. A written personalized care plan for preventive services as well as general preventive health recommendations were provided to patient.     Mickeal Needy, LPN   01/13/9372   Nurse Notes:  Patient is cogitatively intact. There were no vitals filed for this visit. There is no height or weight on file to calculate BMI. Patient stated that she has no issues with gait or balance; does not use any assistive devices.

## 2020-12-29 ENCOUNTER — Ambulatory Visit (INDEPENDENT_AMBULATORY_CARE_PROVIDER_SITE_OTHER): Payer: Medicare Other | Admitting: Orthopaedic Surgery

## 2020-12-29 ENCOUNTER — Ambulatory Visit: Payer: Self-pay

## 2020-12-29 DIAGNOSIS — M25511 Pain in right shoulder: Secondary | ICD-10-CM

## 2020-12-29 NOTE — Progress Notes (Signed)
Office Visit Note   Patient: Kaylee Cardenas           Date of Birth: Dec 11, 1950           MRN: [id number] Visit Date: 12/29/2020              Requested by: Myrlene Broker, MD [address] [place],  [place] [address] PCP: Myrlene Broker, MD   Assessment & Plan: Visit Diagnoses:  1. Acute pain of right shoulder     Plan: Impression is traumatic rotator cuff tear from recent fall.  Given her age and activity level and based on our discussion of treatment options she agreed to a trial of outpatient physical therapy.  We will recheck her in 6 weeks.  Follow-Up Instructions: Return in about 6 weeks (around 02/09/2021).   Orders:  Orders Placed This Encounter  Procedures  . XR Shoulder Right  . Ambulatory referral to Physical Therapy   No orders of the defined types were placed in this encounter.     Procedures: No procedures performed   Clinical Data: No additional findings.   Subjective: Chief Complaint  Patient presents with  . Right Shoulder - Pain    Kaylee Cardenas is a 70 year old female who I saw in September 2020 for a right shoulder pain that got better.  Unfortunately last week she tripped and fell directly on her right shoulder and since then she has had pain and inability to raise her arm.  Denies any numbness and tingling.  Denies any neck pain.   Review of Systems  Constitutional: Negative.   HENT: Negative.   Eyes: Negative.   Respiratory: Negative.   Cardiovascular: Negative.   Endocrine: Negative.   Musculoskeletal: Negative.   Neurological: Negative.   Hematological: Negative.   Psychiatric/Behavioral: Negative.   All other systems reviewed and are negative.    Objective: Vital Signs: There were no vitals taken for this visit.  Physical Exam Vitals and nursing note reviewed.  Constitutional:      Appearance: She is well-developed and well-nourished.  Pulmonary:     Effort: Pulmonary effort is normal.  Skin:    General: Skin  is warm.     Capillary Refill: Capillary refill takes less than 2 seconds.  Neurological:     Mental Status: She is alert and oriented to person, place, and time.  Psychiatric:        Mood and Affect: Mood and affect normal.        Behavior: Behavior normal.        Thought Content: Thought content normal.        Judgment: Judgment normal.     Ortho Exam Right shoulder shows normal passive range of motion with pain.  She shrugs with abduction and forward flexion.  Positive empty can.  Positive drop arm.  Pain and weakness with testing of infraspinatus.  Belly press is normal. Specialty Comments:  No specialty comments available.  Imaging: XR Shoulder Right  Result Date: 12/29/2020 No acute or structural abnormalities.  Moderate AC joint arthrosis.    PMFS History: Patient Active Problem List   Diagnosis Date Noted  . Encounter for general adult medical examination with abnormal findings 10/14/2020  . Hyperlipidemia LDL goal <130 10/14/2020  . Palpitations 10/14/2020  . Bradycardia 10/14/2020  . Osteopenia 04/27/2017  . Prediabetes 04/27/2017  . Essential hypertension 02/03/2016   Past Medical History:  Diagnosis Date  . Abnormal mammogram   . Colon polyps   . Dizzy spells   .  Headache, migraine   . Kidney stones   . Migraine   . Osteopenia   . Osteoporosis     Family History  Problem Relation Age of Onset  . Arthritis Mother   . Cancer Mother        colon  . Kidney disease Mother   . Diabetes Mother   . Diabetes Sister   . Hypertension Sister   . Breast cancer Sister 72  . Kidney disease Brother   . Diabetes Brother   . Hypertension Brother   . Mental illness Brother   . Cancer Son        prostate    Past Surgical History:  Procedure Laterality Date  . ABDOMINAL HYSTERECTOMY     Social History   Occupational History  . Not on file  Tobacco Use  . Smoking status: Never Smoker  . Smokeless tobacco: Never Used  Substance and Sexual Activity  .  Alcohol use: No  . Drug use: Not on file  . Sexual activity: Not on file

## 2021-01-12 ENCOUNTER — Encounter: Payer: Self-pay | Admitting: Physical Therapy

## 2021-01-12 ENCOUNTER — Other Ambulatory Visit: Payer: Self-pay

## 2021-01-12 ENCOUNTER — Ambulatory Visit (INDEPENDENT_AMBULATORY_CARE_PROVIDER_SITE_OTHER): Payer: Medicare Other | Admitting: Physical Therapy

## 2021-01-12 DIAGNOSIS — M6281 Muscle weakness (generalized): Secondary | ICD-10-CM | POA: Diagnosis not present

## 2021-01-12 DIAGNOSIS — M25611 Stiffness of right shoulder, not elsewhere classified: Secondary | ICD-10-CM | POA: Diagnosis not present

## 2021-01-12 DIAGNOSIS — M25511 Pain in right shoulder: Secondary | ICD-10-CM

## 2021-01-12 NOTE — Therapy (Signed)
[facility] Physical Therapy [address] [place], [place], [address] Phone: [phone]   Fax:  [phone]  Physical Therapy Evaluation  Patient Details  Name: Nisha Dhami MRN: [id number] Date of Birth: 01-Jan-1951 Referring Provider (PT): Gershon Mussel, MD   Encounter Date: 01/12/2021   PT End of Session - 01/12/21 1442    Visit Number 1    Number of Visits 16    Date for PT Re-Evaluation 02/26/21    Authorization Type UHC, Medicare    Progress Note Due on Visit 10    PT Start Time 1430    PT Stop Time 1510    PT Time Calculation (min) 40 min    Activity Tolerance Patient tolerated treatment well    Behavior During Therapy [facility] for tasks assessed/performed           Past Medical History:  Diagnosis Date  . Abnormal mammogram   . Colon polyps   . Dizzy spells   . Headache, migraine   . Kidney stones   . Migraine   . Osteopenia   . Osteoporosis     Past Surgical History:  Procedure Laterality Date  . ABDOMINAL HYSTERECTOMY      There were no vitals filed for this visit.    Subjective Assessment - 01/12/21 1433    Subjective Pt arriving to therapy reporting fall about 2 weeks ago when walking. Pt reporting fall on her right side. Pt reporting decresed ROM and limitations in lifting. Pt stating she has to use her left arm to help lift her right. Pt reporting pain in R shoulder 5/10 upon arrival. Pt feels she stretched it this morning which may have aggrivated it.    Pertinent History elevated BP that is being monitored by MD,    Limitations House hold activities;Lifting    How long can you sit comfortably? unlimited    Diagnostic tests X-ray: moderate AC joint arthritis    Patient Stated Goals Stop hurting, be able to lift my arm    Currently in Pain? Yes    Pain Score 5     Pain Location Shoulder    Pain Orientation Right    Pain Descriptors / Indicators Aching;Throbbing    Pain Type Acute pain    Pain Onset 1 to 4 weeks ago    Pain  Frequency Constant    Aggravating Factors  movement    Pain Relieving Factors resting, pain patch    Effect of Pain on Daily Activities difficluty with ADL's and yard work              [facility] PT Assessment - 01/12/21 0001      Assessment   Medical Diagnosis m25.511, R shoulder pain    Referring Provider (PT) Gershon Mussel, MD    Onset Date/Surgical Date 12/25/20    Hand Dominance Right    Prior Therapy no      Precautions   Precautions None      Restrictions   Weight Bearing Restrictions No      Balance Screen   Has the patient fallen in the past 6 months Yes    How many times? 1    Has the patient had a decrease in activity level because of a fear of falling?  No    Is the patient reluctant to leave their home because of a fear of falling?  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    Leisure working in yard      Cognition   Overall Cognitive Status Within Functional Limits for tasks assessed      Observation/Other Assessments   Focus on Therapeutic Outcomes (FOTO)  42 (66 predicted)      Posture/Postural Control   Posture/Postural Control Postural limitations    Postural Limitations Rounded Shoulders;Forward head      ROM / Strength   AROM / PROM / Strength AROM;PROM;Strength      AROM   Overall AROM  Deficits    Overall AROM Comments all measurements taken in supine, painful ROM in all positions using R UE    AROM Assessment Site Shoulder    Right/Left Shoulder Right;Left    Right Shoulder Extension 30 Degrees    Right Shoulder Flexion 140 Degrees    Right Shoulder ABduction 120 Degrees     Right Shoulder Internal Rotation 60 Degrees    Right Shoulder External Rotation 75 Degrees    Left Shoulder Extension 45 Degrees    Left Shoulder Flexion 160 Degrees    Left Shoulder ABduction 160 Degrees    Left Shoulder Internal Rotation 65 Degrees    Left Shoulder External Rotation 85 Degrees      PROM   Overall PROM  Deficits    Overall PROM Comments pain noted at end ranges    PROM Assessment Site Shoulder    Right/Left Shoulder Right    Right Shoulder Extension 35 Degrees    Right Shoulder Flexion 148 Degrees    Right Shoulder ABduction 132 Degrees    Right Shoulder Internal Rotation 62 Degrees    Right Shoulder External Rotation 78 Degrees      Strength   Overall Strength Deficits    Strength Assessment Site Shoulder    Right/Left Shoulder Right;Left    Right Shoulder Flexion 2/5    Right Shoulder Extension 3/5    Right Shoulder ABduction 2/5    Right Shoulder Internal Rotation 3/5    Right Shoulder External Rotation 3/5    Right Shoulder Horizontal ABduction 2/5    Right Shoulder Horizontal ADduction 2/5    Left Shoulder Flexion 5/5    Left Shoulder Extension 5/5    Left Shoulder ABduction 5/5    Left Shoulder Internal Rotation 5/5    Left Shoulder External Rotation 5/5      Palpation   Palpation comment TTP: R upper trap and levaotr with active trigger points noted, R anterior joint capsule      Special Tests    Special Tests Rotator Cuff Impingement    Rotator Cuff Impingment tests Empty Can test;Drop Arm test;Hawkins- Kennedy test      Hawkins-Kennedy test   Findings Positive    Side Right      Empty Can test   Findings Positive    Side Right      Drop Arm test   Findings Positive    Side Right                      Objective measurements completed on examination: See above  findings.               PT Education - 01/12/21 1441    Education Details PT POC, HEP    Person(s) Educated Patient    Methods  Explanation;Demonstration;Handout;Verbal cues;Tactile cues    Comprehension Returned demonstration;Verbalized understanding            PT Short Term Goals - 01/12/21 1444      PT SHORT TERM GOAL #1   Title Pt will be independent in her HEP.    Time 3    Period Weeks    Status New    Target Date 02/05/21             PT Long Term Goals - 01/12/21 1624      PT LONG TERM GOAL #1   Title Pt will be able to perform shoulder flexion to >/= 160 degrees in R UE for ADL's.    Time 6    Period Weeks    Status New    Target Date 02/26/21      PT LONG TERM GOAL #2   Title Pt will be able to improve her R UE strength to >/= 4/5 for ALD's.    Time 6    Period Weeks    Status New    Target Date 02/26/21      PT LONG TERM GOAL #3   Title Pt wil limprove her FOTO score to >/= 66.    Time 6    Period Weeks    Status New      PT LONG TERM GOAL #4   Title Pt will be independent in her HEP and advancement.    Time 6    Period Weeks    Status New    Target Date 02/26/21      PT LONG TERM GOAL #5   Title Pt will be able to lift 5# object from counter height to over head with pain </=2/10.                  Plan - 01/12/21 1634    Clinical Impression Statement Pt presenting today for PT evaluation of her R shoulder pain which began after a fall on 12/25/2020. Pt presenting with weakness noted in R UE with inability to lift against gravity. Pt reporting varying pain with certain movements and movements tend to make the pain worse. Pt with limited AROM and PROM in R shoulder. X-ray revealed moderate AC joint arthritis. Pt presenting with positive Leanord Asal, positive Empty Can and Positive Drop Arm on R UE. Pt could benefit from skilled PT to address pt's deficitis with below interventions.    Personal Factors and Comorbidities Comorbidity 3+    Comorbidities osteoporosis, HA, migraines, colon polyps, bradycardia, hyperlipidemia, kidney stones,    Examination-Activity  Limitations Lift;Carry;Reach Overhead    Examination-Participation Restrictions Church;Community Activity;Laundry;Other    Stability/Clinical Decision Making Stable/Uncomplicated    Clinical Decision Making Low    Rehab Potential Good    PT Frequency 2x / week    PT Duration 6 weeks    PT Treatment/Interventions ADLs/Self Care Home Management;Cryotherapy;Electrical Stimulation;Moist Heat;Ultrasound;Iontophoresis 4mg /ml Dexamethasone;Therapeutic exercise;Therapeutic activities;Functional mobility training;Stair training;Neuromuscular re-education;Passive range of motion;Manual techniques;Dry needling;Taping    PT Next Visit Plan shoulder strengthening, ROM, mobs, progress HEP as appropriate    PT Home Exercise Plan Access Code:  URL: [email or web address]  Date: 01/12/2021  Prepared by: 03/14/2021    Exercises  Seated Shoulder Flexion Towel Slide at Table Top -  2 x daily - 7 x weekly - 10 reps - 5 seconds hold  Seated Shoulder Abduction Towel Slide at Table Top - 2 x daily - 7 x weekly - 10 reps - 5 seconds hold  Supine Shoulder External Rotation in 45 Degrees Abduction AAROM with Dowel - 2 x daily - 7 x weekly - 3 sets - 10 reps - 10 seconds hold  Isometric Shoulder Extension at Wall - 2 x daily - 7 x weekly - 5 reps - 5 seconds hold  Isometric Shoulder External Rotation at Wall - 2 x daily - 7 x weekly - 5 reps - 5 seconds hold  Isometric Shoulder Flexion at Wall - 2 x daily - 7 x weekly - 5 reps - 5 seconds hold  Standing Isometric Shoulder Internal Rotation at Doorway - 2 x daily - 7 x weekly - 5 reps - 5 seconds hold    Consulted and Agree with Plan of Care Patient           Patient will benefit from skilled therapeutic intervention in order to improve the following deficits and impairments:  Postural dysfunction,Pain,Decreased strength,Decreased activity tolerance,Impaired UE functional use  Visit Diagnosis: Acute pain of right shoulder  Stiffness of right  shoulder, not elsewhere classified  Muscle weakness (generalized)     Problem List Patient Active Problem List   Diagnosis Date Noted  . Encounter for general adult medical examination with abnormal findings 10/14/2020  . Hyperlipidemia LDL goal <130 10/14/2020  . Palpitations 10/14/2020  . Bradycardia 10/14/2020  . Osteopenia 04/27/2017  . Prediabetes 04/27/2017  . Essential hypertension 02/03/2016    Sharmon Leyden, PT, MPT 01/12/2021, 4:43 PM  [facility] Physical Therapy [address] [place], [place], [address] Phone: [phone]   Fax:  [phone]  Name: Khaniyah Bezek MRN: [id number] Date of Birth: 01-Oct-1951

## 2021-01-14 ENCOUNTER — Encounter: Payer: Self-pay | Admitting: Physical Therapy

## 2021-01-28 ENCOUNTER — Ambulatory Visit (INDEPENDENT_AMBULATORY_CARE_PROVIDER_SITE_OTHER): Payer: Medicare Other | Admitting: Rehabilitative and Restorative Service Providers"

## 2021-01-28 ENCOUNTER — Encounter: Payer: Self-pay | Admitting: Rehabilitative and Restorative Service Providers"

## 2021-01-28 ENCOUNTER — Other Ambulatory Visit: Payer: Self-pay

## 2021-01-28 DIAGNOSIS — M25611 Stiffness of right shoulder, not elsewhere classified: Secondary | ICD-10-CM | POA: Diagnosis not present

## 2021-01-28 DIAGNOSIS — M6281 Muscle weakness (generalized): Secondary | ICD-10-CM | POA: Diagnosis not present

## 2021-01-28 DIAGNOSIS — M25511 Pain in right shoulder: Secondary | ICD-10-CM

## 2021-01-28 NOTE — Patient Instructions (Signed)
Access Code: [email or web address] URL: [email or web address] Date: 01/28/2021 Prepared by: Chyrel Masson  Exercises Seated Shoulder Flexion Towel Slide at Table Top - 2 x daily - 7 x weekly - 10 reps - 5 seconds hold Seated Shoulder Abduction Towel Slide at Table Top - 2 x daily - 7 x weekly - 10 reps - 5 seconds hold Supine Shoulder External Rotation in 45 Degrees Abduction AAROM with Dowel - 2 x daily - 7 x weekly - 3 sets - 10 reps - 10 seconds hold Isometric Shoulder External Rotation at Wall - 2 x daily - 7 x weekly - 5 reps - 5 seconds hold Isometric Shoulder Flexion at Wall - 2 x daily - 7 x weekly - 5 reps - 5 seconds hold Standing Isometric Shoulder Internal Rotation at Doorway - 2 x daily - 7 x weekly - 5 reps - 5 seconds hold Supine Shoulder Flexion Extension Full Range AROM - 2 x daily - 7 x weekly - 3 sets - 15 reps Sidelying Shoulder Abduction Palm Forward - 2 x daily - 7 x weekly - 3 sets - 10 reps

## 2021-01-28 NOTE — Therapy (Signed)
[place], [place], [address] Phone: [phone]   Fax:  [phone]  Physical Therapy Treatment  Patient Details  Name: Kaylee Cardenas MRN: [id number] Date of Birth: September 17, 1951 Referring Provider (PT): Frankey Shown, MD   Encounter Date: 01/28/2021   PT End of Session - 01/28/21 1426    Visit Number 2    Number of Visits 16    Date for PT Re-Evaluation 02/26/21    Authorization Type UHC, Medicare    Progress Note Due on Visit 10    PT Start Time 1426    PT Stop Time 1504    PT Time Calculation (min) 38 min    Activity Tolerance Patient tolerated treatment well    Behavior During Therapy [facility] for tasks assessed/performed           Past Medical History:  Diagnosis Date  . Abnormal mammogram   . Colon polyps   . Dizzy spells   . Headache, migraine   . Kidney stones   . Migraine   . Osteopenia   . Osteoporosis     Past Surgical History:  Procedure Laterality Date  . ABDOMINAL HYSTERECTOMY      There were no vitals filed for this visit.   Subjective Assessment - 01/28/21 1429    Subjective Pt. stated feeling ok today.  Still having trouble c lifting arm.  Doing HEP, sometimes 1x/day, sometimes 2x/day    Pertinent History elevated BP that is being monitored by MD,    Limitations House hold activities;Lifting    How long can you sit comfortably? unlimited    Diagnostic tests X-ray: moderate AC joint arthritis    Patient Stated Goals Stop hurting, be able to lift my arm    Currently in Pain? Yes    Pain Score 1     Pain Location Shoulder    Pain Orientation Right    Pain Descriptors / Indicators Aching    Pain Onset 1 to 4 weeks ago    Aggravating Factors  lifting movement    Pain Relieving Factors resting              [facility] PT Assessment - 01/28/21 0001      Assessment   Medical Diagnosis m25.511, Rt shoulder pain    Referring Provider (PT) Frankey Shown, MD    Onset Date/Surgical Date  12/25/20    Hand Dominance Right      AROM   Right Shoulder Flexion 140 Degrees   supine   Right Shoulder External Rotation 90 Degrees   supine in 90 deg abd     Special Tests   Other special tests (+) Painful arc Rt                         [facility] Adult PT Treatment/Exercise - 01/28/21 0001      Self-Care   Self-Care Other Self-Care Comments    Other Self-Care Comments  Education and conversation regarding importance and rationale of HEP selection and goals of use. Cues for seqeuncing.      Exercises   Exercises Shoulder;Other Exercises    Other Exercises  HEP technique review per handout      Shoulder Exercises: Supine   Flexion Right;Strengthening;AROM   x 5, x 15     Shoulder Exercises: Sidelying   ABduction Right   2x 15  PT Short Term Goals - 01/28/21 1454      PT SHORT TERM GOAL #1   Title Pt will be independent in her HEP.    Time 3    Period Weeks    Status Partially Met    Target Date 02/05/21             PT Long Term Goals - 01/12/21 1624      PT LONG TERM GOAL #1   Title Pt will be able to perform shoulder flexion to >/= 160 degrees in R UE for ADL's.    Time 6    Period Weeks    Status New    Target Date 02/26/21      PT LONG TERM GOAL #2   Title Pt will be able to improve her R UE strength to >/= 4/5 for ALD's.    Time 6    Period Weeks    Status New    Target Date 02/26/21      PT LONG TERM GOAL #3   Title Pt wil limprove her FOTO score to >/= 66.    Time 6    Period Weeks    Status New      PT LONG TERM GOAL #4   Title Pt will be independent in her HEP and advancement.    Time 6    Period Weeks    Status New    Target Date 02/26/21      PT LONG TERM GOAL #5   Title Pt will be able to lift 5# object from counter height to over head with pain </=2/10.                 Plan - 01/28/21 1452    Clinical Impression Statement Pt. demonstrated supine passive mobility WFL today c minimal  complaints.  AROM in supine produced painful arc in forward and abduction elevation attempts.  Shrug noted in sitting attempts.  Progressed HEP to include active movement in gravity reduced positioning for strengthening.  Pt. able to reduce passive ROM HEP to prn based off stiffness.    Personal Factors and Comorbidities Comorbidity 3+    Comorbidities osteoporosis, HA, migraines, colon polyps, bradycardia, hyperlipidemia, kidney stones,    Examination-Activity Limitations Lift;Carry;Reach Overhead    Examination-Participation Restrictions Church;Community Activity;Laundry;Other    Stability/Clinical Decision Making Stable/Uncomplicated    Rehab Potential Good    PT Frequency 2x / week    PT Duration 6 weeks    PT Treatment/Interventions ADLs/Self Care Home Management;Cryotherapy;Electrical Stimulation;Moist Heat;Ultrasound;Iontophoresis 70m/ml Dexamethasone;Therapeutic exercise;Therapeutic activities;Functional mobility training;Stair training;Neuromuscular re-education;Passive range of motion;Manual techniques;Dry needling;Taping    PT Next Visit Plan Continue gravity reduced strengthening to avoid shrug, adjust to symptom presentation.    PT Home Exercise Plan [email or web address]   Consulted and Agree with Plan of Care Patient           Patient will benefit from skilled therapeutic intervention in order to improve the following deficits and impairments:  Postural dysfunction,Pain,Decreased strength,Decreased activity tolerance,Impaired UE functional use  Visit Diagnosis: Acute pain of right shoulder  Stiffness of right shoulder, not elsewhere classified  Muscle weakness (generalized)     Problem List Patient Active Problem List   Diagnosis Date Noted  . Encounter for general adult medical examination with abnormal findings 10/14/2020  . Hyperlipidemia LDL goal <130 10/14/2020  . Palpitations 10/14/2020  . Bradycardia 10/14/2020  . Osteopenia 04/27/2017  . Prediabetes 04/27/2017  .  Essential hypertension 02/03/2016   MScot Jun  PT, DPT, OCS, ATC 01/28/21  3:05 PM    [facility] [address] [place], [place], [address] Phone: [phone]   Fax:  [phone]  Name: Kaylee Cardenas MRN: [id number] Date of Birth: [birth year]

## 2021-02-08 ENCOUNTER — Encounter: Payer: Medicare Other | Admitting: Physical Therapy

## 2021-02-10 ENCOUNTER — Other Ambulatory Visit: Payer: Self-pay

## 2021-02-10 ENCOUNTER — Ambulatory Visit (INDEPENDENT_AMBULATORY_CARE_PROVIDER_SITE_OTHER): Payer: Medicare Other | Admitting: Physical Therapy

## 2021-02-10 DIAGNOSIS — M25511 Pain in right shoulder: Secondary | ICD-10-CM | POA: Diagnosis not present

## 2021-02-10 DIAGNOSIS — M6281 Muscle weakness (generalized): Secondary | ICD-10-CM

## 2021-02-10 DIAGNOSIS — M25611 Stiffness of right shoulder, not elsewhere classified: Secondary | ICD-10-CM | POA: Diagnosis not present

## 2021-02-10 NOTE — Therapy (Signed)
[facility] [place], [place], [address] Phone: [phone]   Fax:  [phone]  Physical Therapy Treatment  Patient Details  Name: Kaylee Cardenas MRN: [id number] Date of Birth: 1951-08-18 Referring Provider (PT): Frankey Shown, MD   Encounter Date: 02/10/2021   PT End of Session - 02/10/21 1604    Visit Number 3    Number of Visits 16    Date for PT Re-Evaluation 02/26/21    Authorization Type UHC, Medicare    Progress Note Due on Visit 10    PT Start Time 1520    PT Stop Time 1600    PT Time Calculation (min) 40 min    Activity Tolerance Patient tolerated treatment well    Behavior During Therapy [facility] for tasks assessed/performed           Past Medical History:  Diagnosis Date  . Abnormal mammogram   . Colon polyps   . Dizzy spells   . Headache, migraine   . Kidney stones   . Migraine   . Osteopenia   . Osteoporosis     Past Surgical History:  Procedure Laterality Date  . ABDOMINAL HYSTERECTOMY      There were no vitals filed for this visit.   Subjective Assessment - 02/10/21 1529    Subjective Pt. relays her Rt shoulder is improving some and she has been doing her HEP.    Pertinent History elevated BP that is being monitored by MD,    Limitations House hold activities;Lifting    How long can you sit comfortably? unlimited    Diagnostic tests X-ray: moderate AC joint arthritis    Patient Stated Goals Stop hurting, be able to lift my arm    Pain Onset 1 to 4 weeks ago                             [facility] Adult PT Treatment/Exercise - 02/10/21 0001      Shoulder Exercises: Supine   External Rotation AAROM;Right;10 reps    External Rotation Limitations 3#bar    Flexion AAROM;Right;10 reps    Flexion Limitations 3#bar    ABduction Right;10 reps;AAROM    ABduction Limitations 3#bar      Shoulder Exercises: Sidelying   External Rotation Right;15 reps    ABduction Right;10 reps       Shoulder Exercises: Standing   Theraband Level (Shoulder External Rotation) Level 2 (Red)    External Rotation Limitations 10 reps bilat ER at same time    Row Both    Theraband Level (Shoulder Row) Level 2 (Red)    Row Limitations 2X10    Other Standing Exercises wall slide X10 flexion, X5 scaption      Shoulder Exercises: Pulleys   Flexion 2 minutes    ABduction 2 minutes      Shoulder Exercises: ROM/Strengthening   UBE (Upper Arm Bike) 2 min fwd, 2 min retro L1                    PT Short Term Goals - 01/28/21 1454      PT SHORT TERM GOAL #1   Title Pt will be independent in her HEP.    Time 3    Period Weeks    Status Partially Met    Target Date 02/05/21             PT Long Term Goals - 01/12/21 1624  PT LONG TERM GOAL #1   Title Pt will be able to perform shoulder flexion to >/= 160 degrees in R UE for ADL's.    Time 6    Period Weeks    Status New    Target Date 02/26/21      PT LONG TERM GOAL #2   Title Pt will be able to improve her R UE strength to >/= 4/5 for ALD's.    Time 6    Period Weeks    Status New    Target Date 02/26/21      PT LONG TERM GOAL #3   Title Pt wil limprove her FOTO score to >/= 66.    Time 6    Period Weeks    Status New      PT LONG TERM GOAL #4   Title Pt will be independent in her HEP and advancement.    Time 6    Period Weeks    Status New    Target Date 02/26/21      PT LONG TERM GOAL #5   Title Pt will be able to lift 5# object from counter height to over head with pain </=2/10.                 Plan - 02/10/21 1605    Clinical Impression Statement some improvment with Rt shoulder AROM but still with limitations with this due to pain and weakness. Progressed strengthening and ROM to her tolerance today. Continue POC    Personal Factors and Comorbidities Comorbidity 3+    Comorbidities osteoporosis, HA, migraines, colon polyps, bradycardia, hyperlipidemia, kidney stones,     Examination-Activity Limitations Lift;Carry;Reach Overhead    Examination-Participation Restrictions Church;Community Activity;Laundry;Other    Stability/Clinical Decision Making Stable/Uncomplicated    Rehab Potential Good    PT Frequency 2x / week    PT Duration 6 weeks    PT Treatment/Interventions ADLs/Self Care Home Management;Cryotherapy;Electrical Stimulation;Moist Heat;Ultrasound;Iontophoresis 20m/ml Dexamethasone;Therapeutic exercise;Therapeutic activities;Functional mobility training;Stair training;Neuromuscular re-education;Passive range of motion;Manual techniques;Dry needling;Taping    PT Next Visit Plan Continue gravity reduced strengthening to avoid shrug, adjust to symptom presentation.    PT Home Exercise Plan [id number]   Consulted and Agree with Plan of Care Patient           Patient will benefit from skilled therapeutic intervention in order to improve the following deficits and impairments:  Postural dysfunction,Pain,Decreased strength,Decreased activity tolerance,Impaired UE functional use  Visit Diagnosis: Acute pain of right shoulder  Stiffness of right shoulder, not elsewhere classified  Muscle weakness (generalized)     Problem List Patient Active Problem List   Diagnosis Date Noted  . Encounter for general adult medical examination with abnormal findings 10/14/2020  . Hyperlipidemia LDL goal <130 10/14/2020  . Palpitations 10/14/2020  . Bradycardia 10/14/2020  . Osteopenia 04/27/2017  . Prediabetes 04/27/2017  . Essential hypertension 02/03/2016    BSilvestre Mesi4/04/2021, 4:07 PM  [facility] Center[facility] [address] [address]Phone: [phone]  Fax:  [phone] Name: Kaylee KarimMRN: [id number] of Birth: 112-25-1952

## 2021-02-25 ENCOUNTER — Other Ambulatory Visit: Payer: Self-pay

## 2021-02-25 ENCOUNTER — Encounter: Payer: Self-pay | Admitting: Rehabilitative and Restorative Service Providers"

## 2021-02-25 ENCOUNTER — Ambulatory Visit (INDEPENDENT_AMBULATORY_CARE_PROVIDER_SITE_OTHER): Payer: Medicare Other | Admitting: Rehabilitative and Restorative Service Providers"

## 2021-02-25 DIAGNOSIS — M6281 Muscle weakness (generalized): Secondary | ICD-10-CM

## 2021-02-25 DIAGNOSIS — M25611 Stiffness of right shoulder, not elsewhere classified: Secondary | ICD-10-CM

## 2021-02-25 DIAGNOSIS — M25511 Pain in right shoulder: Secondary | ICD-10-CM

## 2021-02-25 NOTE — Therapy (Addendum)
[facility] [address] [place], [place], [address] Phone: [phone]   Fax:  [phone]  Physical Therapy Treatment  Patient Details  Name: Kaylee Cardenas MRN: [id number] Date of Birth: 02/10/1951 Referring Provider (PT): Frankey Shown, MD  PHYSICAL THERAPY DISCHARGE SUMMARY  Visits from Start of Care: 4  Current functional level related to goals / functional outcomes: See note "70% better"   Remaining deficits: See note   Education / Equipment: HEP  Plan: Patient agrees to discharge.  Patient goals were partially met. Patient is being discharged due to being pleased with the current functional level.  ?????     Encounter Date: 02/25/2021   PT End of Session - 02/25/21 1459    Visit Number 4    Number of Visits 16    Date for PT Re-Evaluation 02/26/21    Authorization Type UHC, Medicare    Progress Note Due on Visit 10    PT Start Time 1311    PT Stop Time 1349    PT Time Calculation (min) 38 min    Activity Tolerance Patient tolerated treatment well;Patient limited by fatigue    Behavior During Therapy WFL for tasks assessed/performed           Past Medical History:  Diagnosis Date  . Abnormal mammogram   . Colon polyps   . Dizzy spells   . Headache, migraine   . Kidney stones   . Migraine   . Osteopenia   . Osteoporosis     Past Surgical History:  Procedure Laterality Date  . ABDOMINAL HYSTERECTOMY      There were no vitals filed for this visit.   Subjective Assessment - 02/25/21 1452    Subjective Kaylee Cardenas notes "70% improvement" with her R shoulder since starting PT.    Pertinent History Elevated BP that is being monitored by MD,    Limitations House hold activities;Lifting    How long can you sit comfortably? unlimited    Diagnostic tests X-ray: moderate AC joint arthritis    Patient Stated Goals Stop hurting, be able to lift my arm    Currently in Pain? Yes    Pain Score 2     Pain Location Shoulder     Pain Orientation Right    Pain Descriptors / Indicators Aching    Pain Type Chronic pain    Pain Onset More than a month ago    Pain Frequency Constant    Aggravating Factors  Reaching, overhead function and sleeping on the R side    Pain Relieving Factors Exercise and change of position    Effect of Pain on Daily Activities See aggravating factors    Multiple Pain Sites No              [facility] PT Assessment - 02/25/21 0001      ROM / Strength   AROM / PROM / Strength Strength      AROM   Overall AROM  Deficits    AROM Assessment Site Shoulder    Right/Left Shoulder Left;Right      PROM   Overall PROM  Deficits    PROM Assessment Site Shoulder    Right/Left Shoulder Left;Right    Right Shoulder Flexion 150 Degrees    Right Shoulder Internal Rotation 50 Degrees    Right Shoulder External Rotation 110 Degrees    Right Shoulder Horizontal  ADduction 35 Degrees    Left Shoulder Flexion 180 Degrees    Left Shoulder Internal Rotation  60 Degrees    Left Shoulder External Rotation 110 Degrees    Left Shoulder Horizontal ADduction 55 Degrees      Strength   Overall Strength Deficits    Strength Assessment Site Shoulder    Right/Left Shoulder Left;Right    Right Shoulder Internal Rotation --   15.1 pounds   Right Shoulder External Rotation --   3.9 pounds   Left Shoulder Internal Rotation --   15.4 pounds   Left Shoulder External Rotation --   15.4 pounds                        [facility] Adult PT Treatment/Exercise - 02/25/21 0001      Therapeutic Activites    Therapeutic Activities Other Therapeutic Activities    Other Therapeutic Activities Reviewed RA findings, progressed and reviewed HEP, answered questions about non-surgical RTC tears and recommended she follow-up wuth Dr. Erlinda Hong regarding possible MRI if progress with independent PT is unsatisfactory (PT suspects a RTC tear)      Exercises   Exercises Shoulder      Shoulder Exercises: Supine   Protraction  Strengthening;Right;20 reps;Weights    Protraction Weight (lbs) 3#      Shoulder Exercises: Seated   Retraction Strengthening;Both;10 reps;Limitations    Retraction Limitations 5 seconds shoulder blade pinches      Shoulder Exercises: Standing   External Rotation Strengthening;Right;10 reps;Theraband;Limitations    Theraband Level (Shoulder External Rotation) Level 2 (Red)    External Rotation Limitations 2 sets slow eccentrics                  PT Education - 02/25/21 1454    Education Details Reviewed RA findings, updated HEP and discussed long-term management of her R shoulder.  Evidence suggests a possible R RTC tear.  Kaylee Cardenas would like to try her exercises on her own but would like to have the option of returning if progress with her HEP isn't what she would like.  I also suggested she set up an appointment with Dr. Erlinda Hong to get his opinion on her R shoulder and if an MRI might be appropriate (PT suspects a rotator cuff tear).  Kaylee Cardenas has the option of continuing supervised PT if requested, although she seems happy to continue independently.  Her file will be closed if she is not seen in PT by May 23 (> 1 month).    Person(s) Educated Patient    Methods Explanation;Demonstration;Tactile cues;Verbal cues;Handout    Comprehension Verbal cues required;Returned demonstration;Verbalized understanding            PT Short Term Goals - 02/25/21 1457      PT SHORT TERM GOAL #1   Title Pt will be independent in her HEP.    Time 3    Period Weeks    Status Achieved    Target Date 02/05/21             PT Long Term Goals - 02/25/21 1457      PT LONG TERM GOAL #1   Title Pt will be able to perform shoulder flexion to >/= 160 degrees in R UE for ADL's.    Baseline 150 supine    Time 6    Period Weeks    Status On-going      PT LONG TERM GOAL #2   Title Pt will be able to improve her R UE strength to >/= 4/5 for ALD's.    Baseline ER very weak (see objective)  Time 6     Period Weeks    Status Partially Met      PT LONG TERM GOAL #3   Title Pt wil limprove her FOTO score to >/= 66.    Baseline 64 (was 42 at evaluation)    Time 6    Period Weeks    Status On-going      PT LONG TERM GOAL #4   Title Pt will be independent in her HEP and advancement.    Time 6    Period Weeks    Status Achieved      PT LONG TERM GOAL #5   Title Pt will be able to lift 5# object from counter height to over head with pain </=2/10.    Status On-going                 Plan - 02/25/21 1338    Clinical Impression Statement Kaylee Cardenas is very happy with her R shoulder progress since starting PT.  She rates herself "70% improved" and FOTO improved to 64 from 42 (66 Goal) in just 4 PT visits.  She has demonstrated independence and compliance with her HEP which was updated today.  Given severe R shoulder ER strength testing, Kaylee Cardenas likely has a rotator cuff tear.  We discussed non-surgical options and I encouraged her to follow-up with Dr. Erlinda Hong if progress with her independent PT is not acceptable to her.    Personal Factors and Comorbidities Comorbidity 3+    Comorbidities osteoporosis, HA, migraines, colon polyps, bradycardia, hyperlipidemia, kidney stones,    Examination-Activity Limitations Lift;Carry;Reach Overhead    Examination-Participation Restrictions Church;Community Activity;Laundry;Other    Stability/Clinical Decision Making Stable/Uncomplicated    Rehab Potential Good    PT Frequency 2x / week    PT Duration 6 weeks    PT Treatment/Interventions ADLs/Self Care Home Management;Cryotherapy;Electrical Stimulation;Moist Heat;Ultrasound;Iontophoresis 72m/ml Dexamethasone;Therapeutic exercise;Therapeutic activities;Functional mobility training;Stair training;Neuromuscular re-education;Passive range of motion;Manual techniques;Dry needling;Taping    PT Next Visit Plan Continue gravity reduced strengthening to avoid shrug, adjust to symptom presentation.    PT Home Exercise  Plan [id number]   Consulted and Agree with Plan of Care Patient           Patient will benefit from skilled therapeutic intervention in order to improve the following deficits and impairments:  Postural dysfunction,Pain,Decreased strength,Decreased activity tolerance,Impaired UE functional use  Visit Diagnosis: Stiffness of right shoulder, not elsewhere classified  Muscle weakness (generalized)  Acute pain of right shoulder     Problem List Patient Active Problem List   Diagnosis Date Noted  . Encounter for general adult medical examination with abnormal findings 10/14/2020  . Hyperlipidemia LDL goal <130 10/14/2020  . Palpitations 10/14/2020  . Bradycardia 10/14/2020  . Osteopenia 04/27/2017  . Prediabetes 04/27/2017  . Essential hypertension 02/03/2016    RFarley LyPT, MPT 02/25/2021, 3:03 PM  [facility]Physical Therapy [address] [address]Phone: [phone]  Fax:  [phone] Name: Kaylee TatumMRN: [id number] of Birth: 1February 17, 1952

## 2021-02-25 NOTE — Patient Instructions (Signed)
Access Code: [email or web address] URL: [email or web address] Date: 02/25/2021 Prepared by: Pauletta Browns  Exercises Seated Shoulder Flexion Towel Slide at Table Top - 2 x daily - 7 x weekly - 10 reps - 5 seconds hold Seated Shoulder Abduction Towel Slide at Table Top - 2 x daily - 7 x weekly - 10 reps - 5 seconds hold Supine Shoulder External Rotation in 45 Degrees Abduction AAROM with Dowel - 2 x daily - 7 x weekly - 3 sets - 10 reps - 10 seconds hold Isometric Shoulder External Rotation at Wall - 2 x daily - 7 x weekly - 5 reps - 5 seconds hold Isometric Shoulder Flexion at Wall - 2 x daily - 7 x weekly - 5 reps - 5 seconds hold Standing Isometric Shoulder Internal Rotation at Doorway - 2 x daily - 7 x weekly - 5 reps - 5 seconds hold Supine Shoulder Flexion Extension Full Range AROM - 2 x daily - 7 x weekly - 3 sets - 15 reps Sidelying Shoulder Abduction Palm Forward - 2 x daily - 7 x weekly - 3 sets - 10 reps Standing Scapular Retraction - 5 x daily - 7 x weekly - 1 sets - 5 reps - 5 second hold Supine Scapular Protraction in Flexion with Dumbbells - 1-2 x daily - 7 x weekly - 1 sets - 20 reps - 3 seconds hold Shoulder External Rotation with Anchored Resistance with Towel Under Elbow - 1 x daily - 3 x weekly - 2-3 sets - 10 reps - 3 hold

## 2021-06-17 DIAGNOSIS — H52203 Unspecified astigmatism, bilateral: Secondary | ICD-10-CM | POA: Diagnosis not present

## 2021-06-17 DIAGNOSIS — H2513 Age-related nuclear cataract, bilateral: Secondary | ICD-10-CM | POA: Diagnosis not present

## 2021-06-17 DIAGNOSIS — H25013 Cortical age-related cataract, bilateral: Secondary | ICD-10-CM | POA: Diagnosis not present

## 2021-06-17 DIAGNOSIS — H5203 Hypermetropia, bilateral: Secondary | ICD-10-CM | POA: Diagnosis not present

## 2021-06-17 DIAGNOSIS — H524 Presbyopia: Secondary | ICD-10-CM | POA: Diagnosis not present

## 2021-08-12 ENCOUNTER — Ambulatory Visit: Payer: Medicare Other | Admitting: Internal Medicine

## 2021-08-12 ENCOUNTER — Encounter: Payer: Self-pay | Admitting: Internal Medicine

## 2021-08-12 ENCOUNTER — Other Ambulatory Visit: Payer: Self-pay

## 2021-08-12 NOTE — Progress Notes (Signed)
Entered in error

## 2021-08-16 ENCOUNTER — Other Ambulatory Visit: Payer: Self-pay | Admitting: Internal Medicine

## 2021-08-16 DIAGNOSIS — Z1231 Encounter for screening mammogram for malignant neoplasm of breast: Secondary | ICD-10-CM

## 2021-08-23 ENCOUNTER — Ambulatory Visit (INDEPENDENT_AMBULATORY_CARE_PROVIDER_SITE_OTHER): Payer: Medicare Other | Admitting: Internal Medicine

## 2021-08-23 ENCOUNTER — Other Ambulatory Visit: Payer: Self-pay

## 2021-08-23 ENCOUNTER — Encounter: Payer: Self-pay | Admitting: Internal Medicine

## 2021-08-23 VITALS — BP 120/80 | HR 94 | Temp 98.1°F | Resp 18 | Ht 65.5 in | Wt 161.2 lb

## 2021-08-23 DIAGNOSIS — I10 Essential (primary) hypertension: Secondary | ICD-10-CM | POA: Diagnosis not present

## 2021-08-23 DIAGNOSIS — R7303 Prediabetes: Secondary | ICD-10-CM | POA: Diagnosis not present

## 2021-08-23 DIAGNOSIS — E785 Hyperlipidemia, unspecified: Secondary | ICD-10-CM

## 2021-08-23 DIAGNOSIS — Z23 Encounter for immunization: Secondary | ICD-10-CM | POA: Diagnosis not present

## 2021-08-23 DIAGNOSIS — Z0001 Encounter for general adult medical examination with abnormal findings: Secondary | ICD-10-CM | POA: Diagnosis not present

## 2021-08-23 LAB — LIPID PANEL
Cholesterol: 191 mg/dL (ref 0–200)
HDL: 82.2 mg/dL (ref >39.00)
LDL Cholesterol: 96 mg/dL (ref 0–99)
NonHDL: 108.69
Total CHOL/HDL Ratio: 2
Triglycerides: 61 mg/dL (ref 0.0–149.0)
VLDL: 12.2 mg/dL (ref 0.0–40.0)

## 2021-08-23 LAB — CBC
HCT: 42.7 % (ref 36.0–46.0)
Hemoglobin: 13.9 g/dL (ref 12.0–15.0)
MCHC: 32.6 g/dL (ref 30.0–36.0)
MCV: 90.6 fl (ref 78.0–100.0)
Platelets: 141 10*3/uL — ABNORMAL LOW (ref 150.0–400.0)
RBC: 4.72 Mil/uL (ref 3.87–5.11)
RDW: 14.9 % (ref 11.5–15.5)
WBC: 4.7 10*3/uL (ref 4.0–10.5)

## 2021-08-23 LAB — COMPREHENSIVE METABOLIC PANEL
ALT: 9 U/L (ref 0–35)
AST: 13 U/L (ref 0–37)
Albumin: 4 g/dL (ref 3.5–5.2)
Alkaline Phosphatase: 67 U/L (ref 39–117)
BUN: 17 mg/dL (ref 6–23)
CO2: 32 mEq/L (ref 19–32)
Calcium: 9.1 mg/dL (ref 8.4–10.5)
Chloride: 107 mEq/L (ref 96–112)
Creatinine, Ser: 1.05 mg/dL (ref 0.40–1.20)
GFR: 53.37 mL/min — ABNORMAL LOW (ref >60.00)
Glucose, Bld: 89 mg/dL (ref 70–99)
Potassium: 3.7 mEq/L (ref 3.5–5.1)
Sodium: 144 mEq/L (ref 135–145)
Total Bilirubin: 0.6 mg/dL (ref 0.2–1.2)
Total Protein: 6.9 g/dL (ref 6.0–8.3)

## 2021-08-23 LAB — HEMOGLOBIN A1C: Hgb A1c MFr Bld: 5.5 % (ref 4.6–6.5)

## 2021-08-23 NOTE — Patient Instructions (Signed)
We will check the labs today. 

## 2021-08-23 NOTE — Progress Notes (Signed)
   Subjective:   Patient ID: Kaylee Cardenas, female    DOB: [birth year], 70 y.o.   MRN: [id number]  HPI The patient is a 70 YO female coming in for physical.   PMH, FMH, social history reviewed and updated  Review of Systems  Constitutional: Negative.   HENT: Negative.    Eyes: Negative.   Respiratory:  Negative for cough, chest tightness and shortness of breath.   Cardiovascular:  Negative for chest pain, palpitations and leg swelling.  Gastrointestinal:  Negative for abdominal distention, abdominal pain, constipation, diarrhea, nausea and vomiting.  Musculoskeletal: Negative.   Skin: Negative.   Neurological: Negative.   Psychiatric/Behavioral: Negative.     Objective:  Physical Exam Constitutional:      Appearance: She is well-developed.  HENT:     Head: Normocephalic and atraumatic.  Cardiovascular:     Rate and Rhythm: Normal rate and regular rhythm.  Pulmonary:     Effort: Pulmonary effort is normal. No respiratory distress.     Breath sounds: Normal breath sounds. No wheezing or rales.  Abdominal:     General: Bowel sounds are normal. There is no distension.     Palpations: Abdomen is soft.     Tenderness: There is no abdominal tenderness. There is no rebound.  Musculoskeletal:     Cervical back: Normal range of motion.  Skin:    General: Skin is warm and dry.  Neurological:     Mental Status: She is alert and oriented to person, place, and time.     Coordination: Coordination normal.    Vitals:   08/23/21 0928  BP: 120/80  Pulse: 94  Resp: 18  Temp: 98.1 F (36.7 C)  TempSrc: Oral  Weight: 161 lb 3.2 oz (73.1 kg)  Height: 5' 5.5" (1.664 m)    This visit occurred during the SARS-CoV-2 public health emergency.  Safety protocols were in place, including screening questions prior to the visit, additional usage of staff PPE, and extensive cleaning of exam room while observing appropriate contact time as indicated for disinfecting solutions.   Assessment &  Plan:

## 2021-08-26 NOTE — Assessment & Plan Note (Signed)
BP at goal off medications. Checking CMP and continue to monitor BP at every visit.

## 2021-08-26 NOTE — Assessment & Plan Note (Signed)
Checking lipid panels and adjust as needed.

## 2021-08-26 NOTE — Assessment & Plan Note (Signed)
Checking HgA1c and adjust as needed.  

## 2021-08-26 NOTE — Assessment & Plan Note (Signed)
Flu shot complete for season. Covid-19 booster counseled. Pneumonia complete. Shingrix counseled to get at pharmacy. Tetanus due 2026. Colonoscopy due 2023. Mammogram scheduled for Nov 2022, pap smear aged out and dexa due 2025. Counseled about sun safety and mole surveillance. Counseled about the dangers of distracted driving. Given 10 year screening recommendations.

## 2021-09-14 ENCOUNTER — Ambulatory Visit: Payer: Medicare Other

## 2021-09-15 ENCOUNTER — Other Ambulatory Visit: Payer: Self-pay

## 2021-09-15 ENCOUNTER — Ambulatory Visit
Admission: RE | Admit: 2021-09-15 | Discharge: 2021-09-15 | Disposition: A | Discharge status: Home / Self Care | Payer: Medicare Other | Source: Ambulatory Visit | Attending: Internal Medicine | Admitting: Internal Medicine

## 2021-09-15 DIAGNOSIS — Z1231 Encounter for screening mammogram for malignant neoplasm of breast: Secondary | ICD-10-CM

## 2021-10-15 ENCOUNTER — Encounter: Payer: Medicare Other | Admitting: Internal Medicine

## 2021-12-16 ENCOUNTER — Ambulatory Visit (INDEPENDENT_AMBULATORY_CARE_PROVIDER_SITE_OTHER): Payer: Medicare Other

## 2021-12-16 ENCOUNTER — Other Ambulatory Visit: Payer: Self-pay

## 2021-12-16 DIAGNOSIS — Z0000 Encounter for general adult medical examination without abnormal findings: Secondary | ICD-10-CM | POA: Diagnosis not present

## 2021-12-16 NOTE — Progress Notes (Signed)
I connected with Kaylee Cardenas today by telephone and verified that I am speaking with the correct person using two identifiers. Location patient: home Location provider: work Persons participating in the virtual visit: patient, provider.   I discussed the limitations, risks, security and privacy concerns of performing an evaluation and management service by telephone and the availability of in person appointments. I also discussed with the patient that there may be a patient responsible charge related to this service. The patient expressed understanding and verbally consented to this telephonic visit.    Interactive audio and video telecommunications were attempted between this provider and patient, however failed, due to patient having technical difficulties OR patient did not have access to video capability.  We continued and completed visit with audio only.  Some vital signs may be absent or patient reported.   Time Spent with patient on telephone encounter: 30 minutes  Subjective:   Kaylee Cardenas is a 71 y.o. female who presents for Medicare Annual (Subsequent) preventive examination.  Review of Systems     Cardiac Risk Factors include: advanced age (>72men, >23 women);family history of premature cardiovascular disease;dyslipidemia;hypertension     Objective:    There were no vitals filed for this visit. There is no height or weight on file to calculate BMI.  Advanced Directives 12/16/2021 01/12/2021 12/15/2020  Does Patient Have a Medical Advance Directive? Yes Yes Yes  Type of Advance Directive Living will;Healthcare Power of [employer];Living will Living will;Healthcare Power of Attorney  Does patient want to make changes to medical advance directive? No - Patient declined - No - Patient declined  Copy of [place] in Chart? No - copy requested - No - copy requested    Current Medications (verified) Outpatient Encounter Medications  as of 12/16/2021  Medication Sig   Bioflavonoid Products (ESTER C PO) Take by mouth.   Biotin 5000 MCG CAPS Take by mouth.   Calcium Carbonate-Vit D-Min (CALCIUM 1200 PO) Take 1 tablet by mouth daily.   cyanocobalamin 100 MCG tablet Take 100 mcg by mouth daily.   fish oil-omega-3 fatty acids 1000 MG capsule Take 2 g by mouth daily.   Lactobacillus (PROBIOTIC ACIDOPHILUS PO) Take by mouth.   Multiple Vitamins-Minerals (MULTIVITAMIN WITH MINERALS) tablet Take 1 tablet by mouth daily.   No facility-administered encounter medications on file as of 12/16/2021.    Allergies (verified) Amlodipine   History: Past Medical History:  Diagnosis Date   Abnormal mammogram    Colon polyps    Dizzy spells    Headache, migraine    Kidney stones    Migraine    Osteopenia    Osteoporosis    Past Surgical History:  Procedure Laterality Date   ABDOMINAL HYSTERECTOMY     Family History  Problem Relation Age of Onset   Arthritis Mother    Cancer Mother        colon   Kidney disease Mother    Diabetes Mother    Diabetes Sister    Hypertension Sister    Breast cancer Sister 73   Kidney disease Brother    Diabetes Brother    Hypertension Brother    Mental illness Brother    Cancer Son        prostate   Social History   Socioeconomic History   Marital status: Single    Spouse name: Not on file   Number of children: Not on file   Years of education: Not on file  Highest education level: Not on file  Occupational History   Not on file  Tobacco Use   Smoking status: Never   Smokeless tobacco: Never  Substance and Sexual Activity   Alcohol use: No   Drug use: Not on file   Sexual activity: Not on file  Other Topics Concern   Not on file  Social History Narrative   Exercise: occasional fund raising walks, yard work   Social Determinants of Radio broadcast assistant Strain: Low Risk    Difficulty of Paying Living Expenses: Not hard at all  Food Insecurity: No Food Insecurity    Worried About Charity fundraiser in the Last Year: Never true   Arboriculturist in the Last Year: Never true  Transportation Needs: No Transportation Needs   Lack of Transportation (Medical): No   Lack of Transportation (Non-Medical): No  Physical Activity: Sufficiently Active   Days of Exercise per Week: 5 days   Minutes of Exercise per Session: 30 min  Stress: No Stress Concern Present   Feeling of Stress : Not at all  Social Connections: Moderately Integrated   Frequency of Communication with Friends and Family: More than three times a week   Frequency of Social Gatherings with Friends and Family: More than three times a week   Attends Religious Services: More than 4 times per year   Active Member of [employer] or Organizations: Yes   Attends Music therapist: More than 4 times per year   Marital Status: Never married    Tobacco Counseling Counseling given: Not Answered   Clinical Intake:  Pre-visit preparation completed: Yes  Pain : No/denies pain     Nutritional Risks: None Diabetes: No  How often do you need to have someone help you when you read instructions, pamphlets, or other written materials from your doctor or pharmacy?: 1 - Never What is the last grade level you completed in school?: Senior at [employer]  Diabetic? no  Interpreter Needed?: No  Information entered by :: Lisette Abu, LPN   Activities of Daily Living In your present state of health, do you have any difficulty performing the following activities: 12/16/2021  Hearing? N  Vision? N  Difficulty concentrating or making decisions? N  Walking or climbing stairs? N  Dressing or bathing? N  Doing errands, shopping? N  Preparing Food and eating ? N  Using the Toilet? N  In the past six months, have you accidently leaked urine? N  Do you have problems with loss of bowel control? N  Managing your Medications? N  Managing your Finances? N  Housekeeping or managing your Housekeeping? N   Some recent data might be hidden    Patient Care Team: Hoyt Koch, MD as PCP - General (Internal Medicine) Rutherford Guys, MD as Consulting Physician (Ophthalmology)  Indicate any recent Medical Services you may have received from other than [facility] providers in the past year (date may be approximate).     Assessment:   This is a routine wellness examination for [facility].  Hearing/Vision screen Hearing Screening - Comments:: Patient denied any hearing difficulty.   No hearing aids.  Vision Screening - Comments:: Patient wears corrective glasses/contacts.  Eye exam done annually by: Dr. Rutherford Guys.  Dietary issues and exercise activities discussed: Current Exercise Habits: Home exercise routine, Type of exercise: walking, Time (Minutes): 30, Frequency (Times/Week): 5, Weekly Exercise (Minutes/Week): 150, Intensity: Moderate, Exercise limited by: None identified   Goals Addressed  This Visit's Progress     Patient Stated (pt-stated)        My goal is to stay physically active and stay hydrated.      Depression Screen PHQ 2/9 Scores 12/16/2021 12/15/2020 10/14/2020 05/07/2019 05/01/2018 04/27/2017 02/03/2016  PHQ - 2 Score 0 0 0 0 0 0 0    Fall Risk Fall Risk  12/16/2021 12/15/2020 10/14/2020 05/07/2019 05/01/2018  Falls in the past year? 0 0 0 0 No  Number falls in past yr: 0 0 - - -  Injury with Fall? 0 0 - - -  Risk for fall due to : No Fall Risks No Fall Risks - - -  Follow up Falls evaluation completed - - - -    FALL RISK PREVENTION PERTAINING TO THE HOME:  Any stairs in or around the home? No  If so, are there any without handrails? No  Home free of loose throw rugs in walkways, pet beds, electrical cords, etc? Yes  Adequate lighting in your home to reduce risk of falls? Yes   ASSISTIVE DEVICES UTILIZED TO PREVENT FALLS:  Life alert? No  Use of a cane, walker or w/c? No  Grab bars in the bathroom? No  Shower chair or bench in shower? No  Elevated  toilet seat or a handicapped toilet? No   TIMED UP AND GO:  Was the test performed? No .  Length of time to ambulate 10 feet: n/a sec.   Gait steady and fast without use of assistive device  Cognitive Function: Normal cognitive status assessed by direct observation by this Nurse Health Advisor. No abnormalities found.          Immunizations Immunization History  Administered Date(s) Administered   Fluad Quad(high Dose 65+) 08/14/2021   Influenza-Unspecified 08/08/2016, 08/07/2018, 08/07/2020   PFIZER(Purple Top)SARS-COV-2 Vaccination 12/28/2019, 01/21/2020, 09/05/2020   Pneumococcal Conjugate-13 09/07/2016   Pneumococcal Polysaccharide-23 05/01/2018   Tdap 08/22/2015   Zoster, Live 08/08/2016    TDAP status: Up to date  Flu Vaccine status: Up to date  Pneumococcal vaccine status: Up to date  Covid-19 vaccine status: Completed vaccines  Qualifies for Shingles Vaccine? Yes   Zostavax completed Yes   Shingrix Completed?: Yes  Screening Tests Health Maintenance  Topic Date Due   Zoster Vaccines- Shingrix (1 of 2) Never done   COVID-19 Vaccine (4 - Booster for Pfizer series) 10/31/2020   COLONOSCOPY (Pts 45-78yrs Insurance coverage will need to be confirmed)  08/03/2022   MAMMOGRAM  09/16/2023   TETANUS/TDAP  08/21/2025   Pneumonia Vaccine 18+ Years old  Completed   INFLUENZA VACCINE  Completed   DEXA SCAN  Completed   Hepatitis C Screening  Completed   HPV VACCINES  Aged Out    Health Maintenance  Health Maintenance Due  Topic Date Due   Zoster Vaccines- Shingrix (1 of 2) Never done   COVID-19 Vaccine (4 - Booster for Pfizer series) 10/31/2020    Colorectal cancer screening: Type of screening: Colonoscopy. Completed 08/03/2017. Repeat every 5 years  Mammogram status: Completed 09/15/2021. Repeat every year  Bone Density status: Completed 05/07/2019. Results reflect: Bone density results: NORMAL. Repeat every 5 years.  Lung Cancer Screening: (Low Dose CT  Chest recommended if Age 62-80 years, 30 pack-year currently smoking OR have quit w/in 15years.) does not qualify.   Lung Cancer Screening Referral: no  Additional Screening:  Hepatitis C Screening: does qualify; Completed yes  Vision Screening: Recommended annual ophthalmology exams for early detection of glaucoma and other  disorders of the eye. Is the patient up to date with their annual eye exam?  Yes  Who is the provider or what is the name of the office in which the patient attends annual eye exams? Rutherford Guys, MD. If pt is not established with a provider, would they like to be referred to a provider to establish care? No .   Dental Screening: Recommended annual dental exams for proper oral hygiene  Community Resource Referral / Chronic Care Management: CRR required this visit?  No   CCM required this visit?  No      Plan:     I have personally reviewed and noted the following in the patients chart:   Medical and social history Use of alcohol, tobacco or illicit drugs  Current medications and supplements including opioid prescriptions.  Functional ability and status Nutritional status Physical activity Advanced directives List of other physicians Hospitalizations, surgeries, and ER visits in previous 12 months Vitals Screenings to include cognitive, depression, and falls Referrals and appointments  In addition, I have reviewed and discussed with patient certain preventive protocols, quality metrics, and best practice recommendations. A written personalized care plan for preventive services as well as general preventive health recommendations were provided to patient.     Sheral Flow, LPN   [id number]   Nurse Notes:  Patient is cogitatively intact. There were no vitals filed for this visit. There is no height or weight on file to calculate BMI. Patient stated that she has no issues with gait or balance; does not use any assistive devices. Medications  reviewed with patient; no opioid use noted.

## 2022-01-02 IMAGING — MG MM DIGITAL SCREENING BILAT W/ TOMO AND CAD
8 series · 8 of 24 positions shown · non-contrast
Comparison: Previous exam(s).

CLINICAL DATA: Screening.

EXAM:
DIGITAL SCREENING BILATERAL MAMMOGRAM WITH TOMOSYNTHESIS AND CAD
TECHNIQUE: Bilateral screening digital craniocaudal and mediolateral oblique
mammograms were obtained. Bilateral screening digital breast
tomosynthesis was performed. The images were evaluated with
computer-aided detection.

[L CC synth-2D]
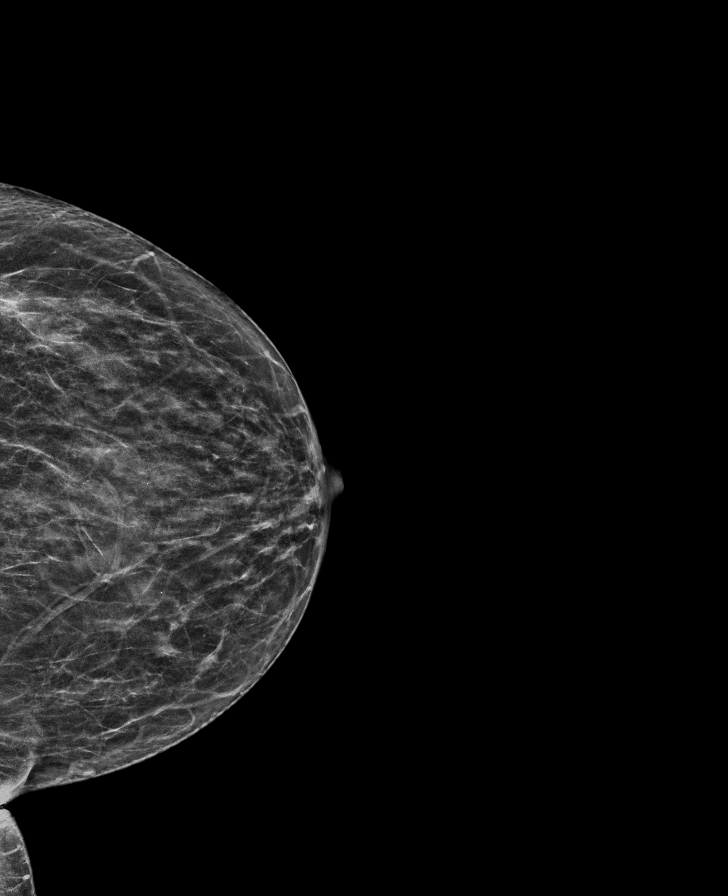

[R MLO synth-2D]
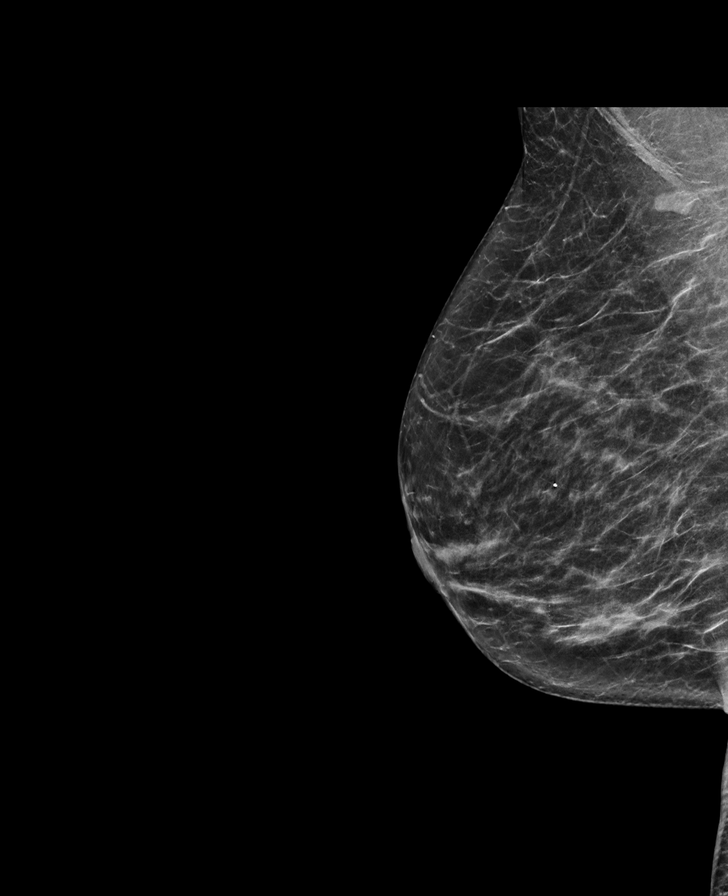

[L MLO synth-2D]
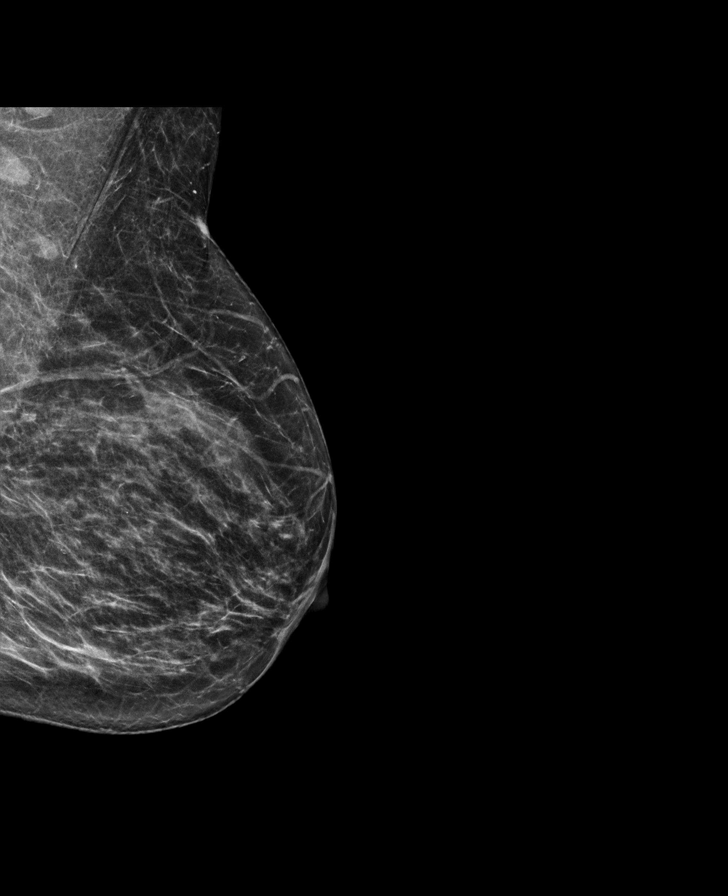

[R CC synth-2D]
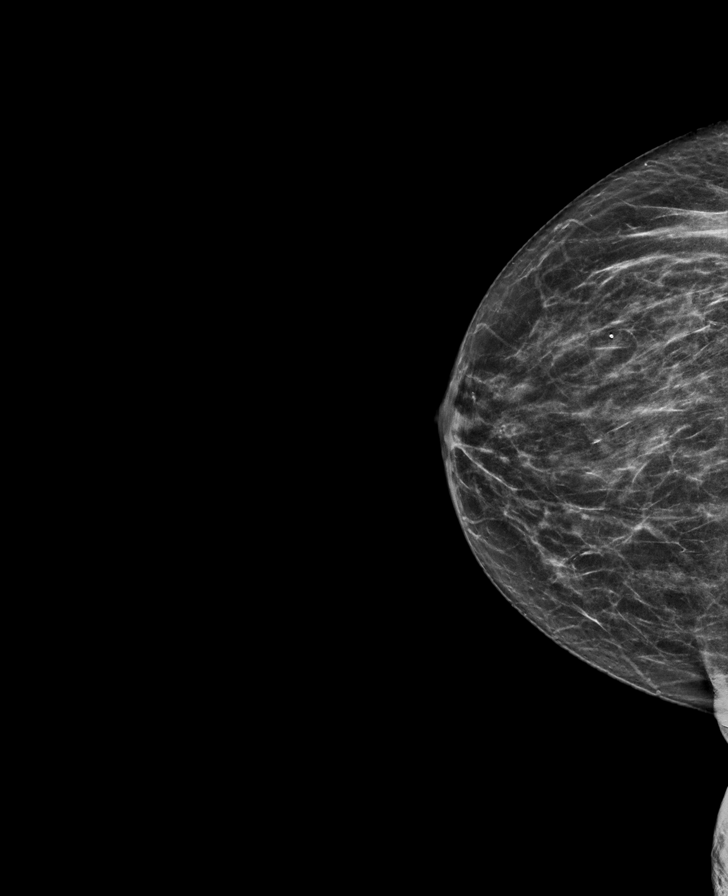

[R CC tomo · tomo slice 29/57.0]
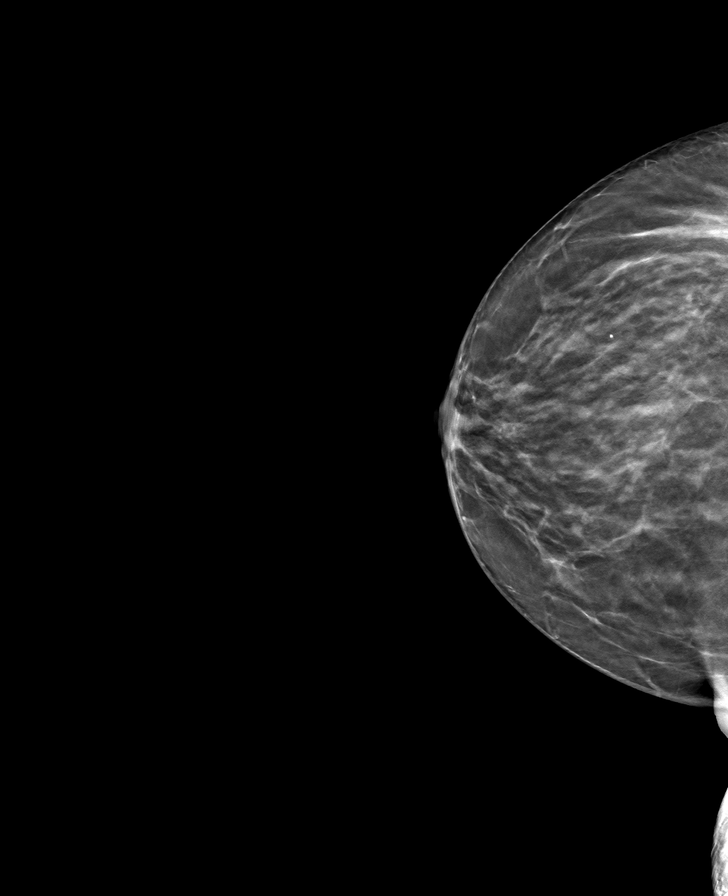

[L MLO tomo · tomo slice 33/64.0]
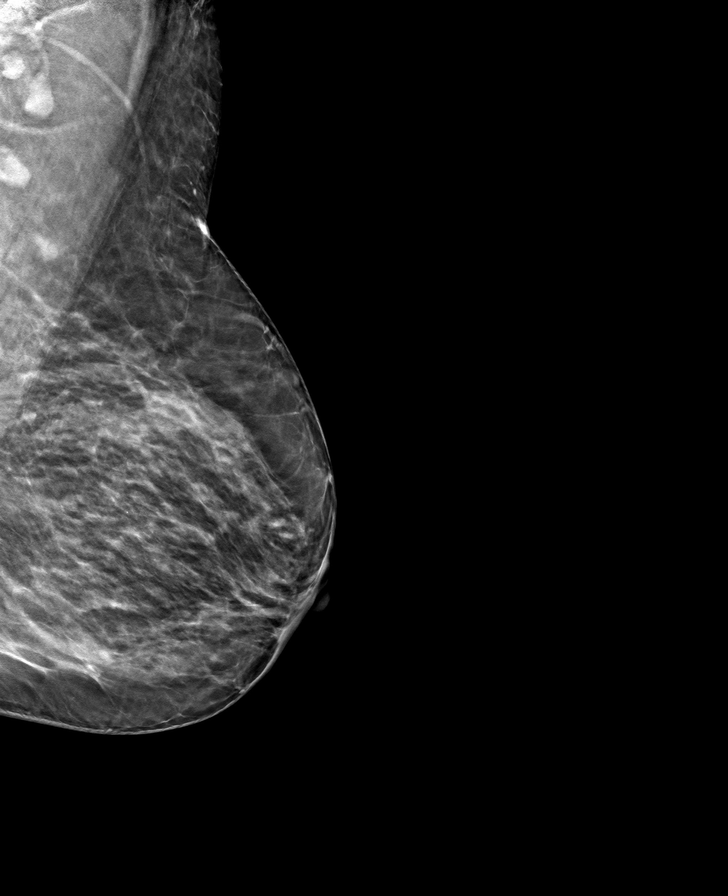

[L CC tomo · tomo slice 27/53.0]
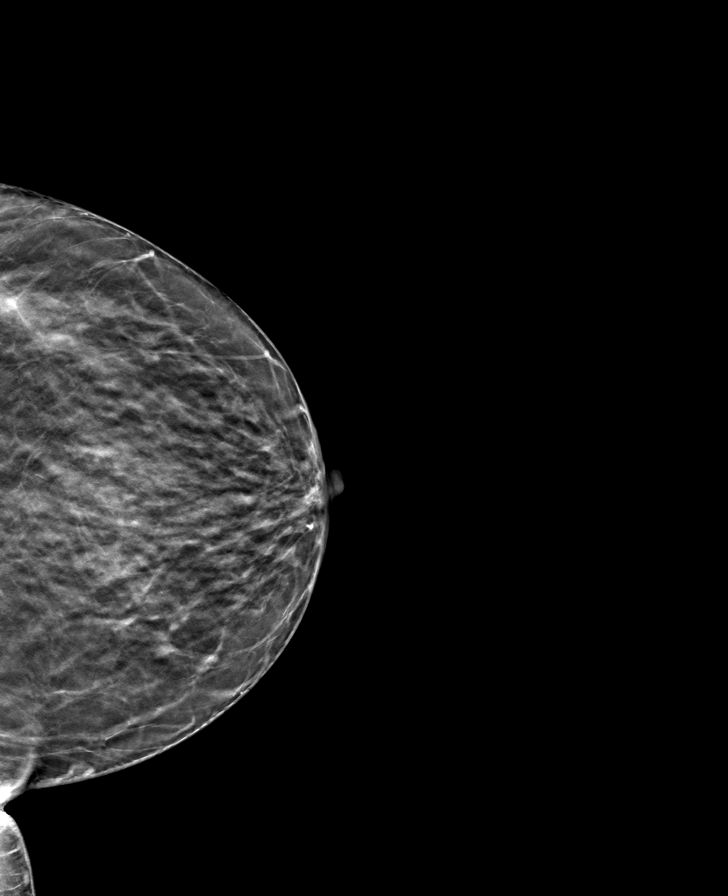

[R MLO tomo · tomo slice 33/66.0]
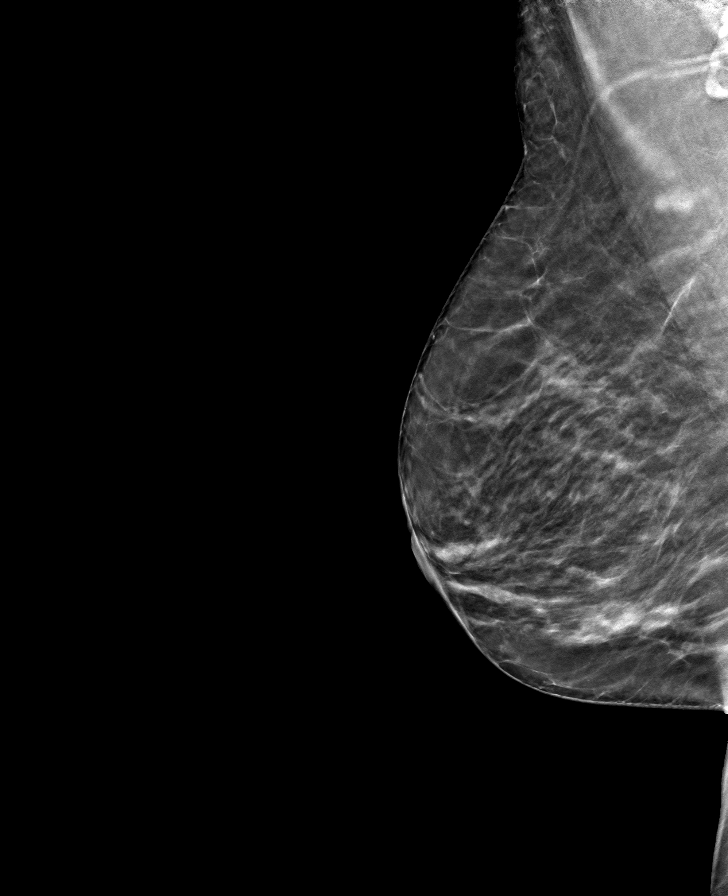

[8 of 24 positions shown; findings below may reference images not displayed]

ACR Breast Density Category c: The breast tissue is heterogeneously
dense, which may obscure small masses.
FINDINGS: There are no findings suspicious for malignancy.
IMPRESSION: No mammographic evidence of malignancy. A result letter of this
screening mammogram will be mailed directly to the patient.

RECOMMENDATION:
Screening mammogram in one year. (Code:[id number])

BI-RADS CATEGORY  1: Negative.

## 2022-06-20 DIAGNOSIS — H52203 Unspecified astigmatism, bilateral: Secondary | ICD-10-CM | POA: Diagnosis not present

## 2022-06-20 DIAGNOSIS — H5203 Hypermetropia, bilateral: Secondary | ICD-10-CM | POA: Diagnosis not present

## 2022-06-20 DIAGNOSIS — H2513 Age-related nuclear cataract, bilateral: Secondary | ICD-10-CM | POA: Diagnosis not present

## 2022-06-20 DIAGNOSIS — H524 Presbyopia: Secondary | ICD-10-CM | POA: Diagnosis not present

## 2022-06-20 DIAGNOSIS — H25013 Cortical age-related cataract, bilateral: Secondary | ICD-10-CM | POA: Diagnosis not present

## 2022-07-27 DIAGNOSIS — Z86010 Personal history of colonic polyps: Secondary | ICD-10-CM | POA: Diagnosis not present

## 2022-08-15 ENCOUNTER — Encounter: Payer: Medicare Other | Admitting: Internal Medicine

## 2022-08-19 ENCOUNTER — Encounter: Payer: Medicare Other | Admitting: Internal Medicine

## 2022-08-30 DIAGNOSIS — K5730 Diverticulosis of large intestine without perforation or abscess without bleeding: Secondary | ICD-10-CM | POA: Diagnosis not present

## 2022-08-30 DIAGNOSIS — K5140 Inflammatory polyps of colon without complications: Secondary | ICD-10-CM | POA: Diagnosis not present

## 2022-08-30 DIAGNOSIS — Z1211 Encounter for screening for malignant neoplasm of colon: Secondary | ICD-10-CM | POA: Diagnosis not present

## 2022-08-30 DIAGNOSIS — Z800 Family history of malignant neoplasm of digestive organs: Secondary | ICD-10-CM | POA: Diagnosis not present

## 2022-08-30 DIAGNOSIS — K635 Polyp of colon: Secondary | ICD-10-CM | POA: Diagnosis not present

## 2022-08-31 ENCOUNTER — Other Ambulatory Visit: Payer: Self-pay | Admitting: Internal Medicine

## 2022-08-31 DIAGNOSIS — Z1231 Encounter for screening mammogram for malignant neoplasm of breast: Secondary | ICD-10-CM

## 2022-09-05 ENCOUNTER — Encounter: Payer: Self-pay | Admitting: Internal Medicine

## 2022-09-05 ENCOUNTER — Ambulatory Visit (INDEPENDENT_AMBULATORY_CARE_PROVIDER_SITE_OTHER): Payer: Medicare Other | Admitting: Internal Medicine

## 2022-09-05 VITALS — BP 136/88 | HR 60 | Ht 65.5 in | Wt 159.0 lb

## 2022-09-05 DIAGNOSIS — Z0000 Encounter for general adult medical examination without abnormal findings: Secondary | ICD-10-CM

## 2022-09-05 DIAGNOSIS — E785 Hyperlipidemia, unspecified: Secondary | ICD-10-CM | POA: Diagnosis not present

## 2022-09-05 DIAGNOSIS — M8580 Other specified disorders of bone density and structure, unspecified site: Secondary | ICD-10-CM | POA: Diagnosis not present

## 2022-09-05 LAB — COMPREHENSIVE METABOLIC PANEL
ALT: 11 U/L (ref 0–35)
AST: 16 U/L (ref 0–37)
Albumin: 4 g/dL (ref 3.5–5.2)
Alkaline Phosphatase: 74 U/L (ref 39–117)
BUN: 11 mg/dL (ref 6–23)
CO2: 32 mEq/L (ref 19–32)
Calcium: 9.3 mg/dL (ref 8.4–10.5)
Chloride: 104 mEq/L (ref 96–112)
Creatinine, Ser: 0.89 mg/dL (ref 0.40–1.20)
GFR: 64.61 mL/min (ref >60.00)
Glucose, Bld: 86 mg/dL (ref 70–99)
Potassium: 4.3 mEq/L (ref 3.5–5.1)
Sodium: 141 mEq/L (ref 135–145)
Total Bilirubin: 0.5 mg/dL (ref 0.2–1.2)
Total Protein: 7 g/dL (ref 6.0–8.3)

## 2022-09-05 LAB — HEMOGLOBIN A1C: Hgb A1c MFr Bld: 5.6 % (ref 4.6–6.5)

## 2022-09-05 LAB — LIPID PANEL
Cholesterol: 200 mg/dL (ref 0–200)
HDL: 90.2 mg/dL (ref >39.00)
LDL Cholesterol: 95 mg/dL (ref 0–99)
NonHDL: 110.25
Total CHOL/HDL Ratio: 2
Triglycerides: 75 mg/dL (ref 0.0–149.0)
VLDL: 15 mg/dL (ref 0.0–40.0)

## 2022-09-05 LAB — CBC
HCT: 44.4 % (ref 36.0–46.0)
Hemoglobin: 14.2 g/dL (ref 12.0–15.0)
MCHC: 32 g/dL (ref 30.0–36.0)
MCV: 91.6 fl (ref 78.0–100.0)
Platelets: 160 10*3/uL (ref 150.0–400.0)
RBC: 4.84 Mil/uL (ref 3.87–5.11)
RDW: 14.7 % (ref 11.5–15.5)
WBC: 5.5 10*3/uL (ref 4.0–10.5)

## 2022-09-05 NOTE — Assessment & Plan Note (Signed)
Flu shot up to date. Covid-19 counseled. Pneumonia complete. Shingrix had 1st not sure about second. Tetanus due 2026. Colonoscopy just had with Dr. Benson Norway. Mammogram up to date, pap smear aged out and dexa up to date. Counseled about sun safety and mole surveillance. Counseled about the dangers of distracted driving. Given 10 year screening recommendations.

## 2022-09-05 NOTE — Assessment & Plan Note (Signed)
Checking lipid panel and CMP and CBC and HgA1c. Adjust as needed. Is diet controlled currently.

## 2022-09-05 NOTE — Progress Notes (Signed)
   Subjective:   Patient ID: Kaylee Cardenas, female    DOB: 1951-07-27, 71 y.o.   MRN: [id number]  HPI The patient is here for physical.  Dr. Benson Norway for colonoscopy  PMH, [facility], social history reviewed and updated  Review of Systems  Constitutional: Negative.   HENT: Negative.    Eyes: Negative.   Respiratory:  Negative for cough, chest tightness and shortness of breath.   Cardiovascular:  Negative for chest pain, palpitations and leg swelling.  Gastrointestinal:  Negative for abdominal distention, abdominal pain, constipation, diarrhea, nausea and vomiting.  Musculoskeletal: Negative.   Skin: Negative.   Neurological: Negative.   Psychiatric/Behavioral: Negative.      Objective:  Physical Exam Constitutional:      Appearance: She is well-developed.  HENT:     Head: Normocephalic and atraumatic.  Cardiovascular:     Rate and Rhythm: Normal rate and regular rhythm.  Pulmonary:     Effort: Pulmonary effort is normal. No respiratory distress.     Breath sounds: Normal breath sounds. No wheezing or rales.  Abdominal:     General: Bowel sounds are normal. There is no distension.     Palpations: Abdomen is soft.     Tenderness: There is no abdominal tenderness. There is no rebound.  Musculoskeletal:     Cervical back: Normal range of motion.  Skin:    General: Skin is warm and dry.  Neurological:     Mental Status: She is alert and oriented to person, place, and time.     Coordination: Coordination normal.     Vitals:   09/05/22 1101  BP: 136/88  Pulse: 60  SpO2: 95%  Weight: 159 lb (72.1 kg)  Height: 5' 5.5" (1.664 m)    Assessment & Plan:

## 2022-09-05 NOTE — Assessment & Plan Note (Signed)
Stable on last DEXA due 2025

## 2022-09-07 ENCOUNTER — Telehealth: Payer: Self-pay | Admitting: Internal Medicine

## 2022-09-07 NOTE — Telephone Encounter (Signed)
Patient called back about message bellow:  Macie Burows, CMA  09/07/2022 11:45 AM EDT Back to Top    Left patient a voicemail   Hoyt Koch, MD  09/05/2022  4:47 PM EDT     Normal/stable labs. No changes needed.    She said she was aware and didn't have any further questions

## 2022-10-24 ENCOUNTER — Ambulatory Visit
Admission: RE | Admit: 2022-10-24 | Discharge: 2022-10-24 | Disposition: A | Discharge status: Home / Self Care | Payer: Medicare Other | Source: Ambulatory Visit | Attending: Internal Medicine | Admitting: Internal Medicine

## 2022-10-24 DIAGNOSIS — Z1231 Encounter for screening mammogram for malignant neoplasm of breast: Secondary | ICD-10-CM | POA: Diagnosis not present

## 2022-11-24 ENCOUNTER — Telehealth: Payer: Self-pay | Admitting: Internal Medicine

## 2022-11-24 NOTE — Telephone Encounter (Signed)
Left message for patient to call back to schedule Medicare Annual Wellness Visit   Last AWV  12/16/21  Please schedule at anytime with [facility] if patient calls the office back.    30 Minutes appointment   Any questions, please call me at [phone]

## 2023-08-15 DIAGNOSIS — H5203 Hypermetropia, bilateral: Secondary | ICD-10-CM | POA: Diagnosis not present

## 2023-08-15 DIAGNOSIS — H52223 Regular astigmatism, bilateral: Secondary | ICD-10-CM | POA: Diagnosis not present

## 2023-08-15 DIAGNOSIS — H25813 Combined forms of age-related cataract, bilateral: Secondary | ICD-10-CM | POA: Diagnosis not present

## 2023-08-15 DIAGNOSIS — H524 Presbyopia: Secondary | ICD-10-CM | POA: Diagnosis not present

## 2023-08-22 ENCOUNTER — Ambulatory Visit (INDEPENDENT_AMBULATORY_CARE_PROVIDER_SITE_OTHER): Payer: Medicare Other | Admitting: Internal Medicine

## 2023-08-22 ENCOUNTER — Encounter: Payer: Self-pay | Admitting: Internal Medicine

## 2023-08-22 VITALS — BP 142/82 | HR 61 | Ht 65.5 in | Wt 161.0 lb

## 2023-08-22 DIAGNOSIS — Z0000 Encounter for general adult medical examination without abnormal findings: Secondary | ICD-10-CM

## 2023-08-22 DIAGNOSIS — Z23 Encounter for immunization: Secondary | ICD-10-CM

## 2023-08-22 DIAGNOSIS — E785 Hyperlipidemia, unspecified: Secondary | ICD-10-CM

## 2023-08-22 DIAGNOSIS — M8580 Other specified disorders of bone density and structure, unspecified site: Secondary | ICD-10-CM | POA: Diagnosis not present

## 2023-08-22 LAB — COMPREHENSIVE METABOLIC PANEL
ALT: 8 U/L (ref 0–35)
AST: 13 U/L (ref 0–37)
Albumin: 3.8 g/dL (ref 3.5–5.2)
Alkaline Phosphatase: 70 U/L (ref 39–117)
BUN: 12 mg/dL (ref 6–23)
CO2: 32 meq/L (ref 19–32)
Calcium: 9.1 mg/dL (ref 8.4–10.5)
Chloride: 106 meq/L (ref 96–112)
Creatinine, Ser: 1.03 mg/dL (ref 0.40–1.20)
GFR: 53.85 mL/min — ABNORMAL LOW (ref >60.00)
Glucose, Bld: 99 mg/dL (ref 70–99)
Potassium: 4 meq/L (ref 3.5–5.1)
Sodium: 143 meq/L (ref 135–145)
Total Bilirubin: 0.4 mg/dL (ref 0.2–1.2)
Total Protein: 6.7 g/dL (ref 6.0–8.3)

## 2023-08-22 LAB — CBC
HCT: 44.5 % (ref 36.0–46.0)
Hemoglobin: 14 g/dL (ref 12.0–15.0)
MCHC: 31.4 g/dL (ref 30.0–36.0)
MCV: 92.5 fL (ref 78.0–100.0)
Platelets: 168 10*3/uL (ref 150.0–400.0)
RBC: 4.81 Mil/uL (ref 3.87–5.11)
RDW: 14.5 % (ref 11.5–15.5)
WBC: 4.7 10*3/uL (ref 4.0–10.5)

## 2023-08-22 LAB — LIPID PANEL
Cholesterol: 199 mg/dL (ref 0–200)
HDL: 88.1 mg/dL (ref >39.00)
LDL Cholesterol: 98 mg/dL (ref 0–99)
NonHDL: 111
Total CHOL/HDL Ratio: 2
Triglycerides: 67 mg/dL (ref 0.0–149.0)
VLDL: 13.4 mg/dL (ref 0.0–40.0)

## 2023-08-22 NOTE — Progress Notes (Signed)
Subjective:   Patient ID: Kaylee Cardenas, female    DOB: 1950/12/30, 72 y.o.   MRN: [id number]  HPI Here for medicare wellness and physical, no new complaints. Please see A/P for status and treatment of chronic medical problems.   Diet: heart healthy Physical activity: active walks Depression/mood screen: negative Hearing: intact to whispered voice Visual acuity: grossly normal with lens, performs annual eye exam  ADLs: capable Fall risk: none Home safety: good Cognitive evaluation: intact to orientation, naming, recall and repetition EOL planning: adv directives discussed  Flowsheet Row Office Visit from 09/05/2022 in [facility] [place]  PHQ-2 Total Score 0       Flowsheet Row Office Visit from 09/05/2022 in [facility] [place]  PHQ-9 Total Score 0         10/14/2020    8:56 AM 12/15/2020    3:33 PM 12/16/2021    2:10 PM 09/05/2022   11:02 AM 08/22/2023    1:18 PM  Fall Risk  Falls in the past year? 0 0 0 0 0  Was there an injury with Fall?  0 0 0 0  Fall Risk Category Calculator  0 0 0 0  Fall Risk Category (Retired)  Low Low Low   (RETIRED) Patient Fall Risk Level  Low fall risk Low fall risk    Patient at Risk for Falls Due to  No Fall Risks No Fall Risks    Fall risk Follow up   Falls evaluation completed  Falls evaluation completed    I have personally reviewed and have noted 1. The patient's medical and social history - reviewed today no changes 2. Their use of alcohol, tobacco or illicit drugs 3. Their current medications and supplements 4. The patient's functional ability including ADL's, fall risks, home safety risks and hearing or visual impairment. 5. Diet and physical activities 6. Evidence for depression or mood disorders 7. Care team reviewed and updated 8.  The patient is not on an opioid pain medication.  Patient Care Team: Myrlene Broker, MD as PCP - General (Internal  Medicine) Jethro Bolus, MD as Consulting Physician (Ophthalmology) Past Medical History:  Diagnosis Date   Abnormal mammogram    Colon polyps    Dizzy spells    Headache, migraine    Kidney stones    Migraine    Osteopenia    Osteoporosis    Past Surgical History:  Procedure Laterality Date   ABDOMINAL HYSTERECTOMY     Family History  Problem Relation Age of Onset   Arthritis Mother    Cancer Mother        colon   Kidney disease Mother    Diabetes Mother    Diabetes Sister    Hypertension Sister    Breast cancer Sister 21   Kidney disease Brother    Diabetes Brother    Hypertension Brother    Mental illness Brother    Cancer Son        prostate   Review of Systems  Constitutional: Negative.   HENT: Negative.    Eyes: Negative.   Respiratory:  Negative for cough, chest tightness and shortness of breath.   Cardiovascular:  Negative for chest pain, palpitations and leg swelling.  Gastrointestinal:  Negative for abdominal distention, abdominal pain, constipation, diarrhea, nausea and vomiting.  Musculoskeletal: Negative.   Skin: Negative.   Neurological: Negative.   Psychiatric/Behavioral: Negative.      Objective:  Physical Exam Constitutional:  Appearance: She is well-developed.  HENT:     Head: Normocephalic and atraumatic.  Cardiovascular:     Rate and Rhythm: Normal rate and regular rhythm.  Pulmonary:     Effort: Pulmonary effort is normal. No respiratory distress.     Breath sounds: Normal breath sounds. No wheezing or rales.  Abdominal:     General: Bowel sounds are normal. There is no distension.     Palpations: Abdomen is soft.     Tenderness: There is no abdominal tenderness. There is no rebound.  Musculoskeletal:     Cervical back: Normal range of motion.  Skin:    General: Skin is warm and dry.  Neurological:     Mental Status: She is alert and oriented to person, place, and time.     Coordination: Coordination normal.     Vitals:    08/22/23 1312 08/22/23 1318 08/22/23 1338  BP: (!) 160/100 (!) 160/100 (!) 142/82  Pulse: 61    TempSrc: Oral    SpO2: 93%    Weight: 161 lb (73 kg)    Height: 5' 5.5" (1.664 m)      Assessment & Plan:  Flu shot given at visit

## 2023-08-22 NOTE — Patient Instructions (Signed)
We will check the labs today. You are due for a bone density next year so we will check that.

## 2023-08-22 NOTE — Assessment & Plan Note (Signed)
Due for dexa 2025. If stable can likely be final.

## 2023-08-22 NOTE — Assessment & Plan Note (Signed)
Flu shot yearly. Pneumonia complete. Shingrix due at pharmacy. Tetanus due 2026. Colonoscopy due 2028. Mammogram due 2025, pap smear aged out and dexa due 2025. Counseled about sun safety and mole surveillance. Counseled about the dangers of distracted driving. Given 10 year screening recommendations.

## 2023-08-22 NOTE — Assessment & Plan Note (Signed)
Checking lipid panel and adjust as needed. Not on medication.

## 2023-09-27 ENCOUNTER — Other Ambulatory Visit: Payer: Self-pay | Admitting: Internal Medicine

## 2023-09-27 DIAGNOSIS — Z1231 Encounter for screening mammogram for malignant neoplasm of breast: Secondary | ICD-10-CM

## 2023-10-26 ENCOUNTER — Ambulatory Visit
Admission: RE | Admit: 2023-10-26 | Discharge: 2023-10-26 | Disposition: A | Discharge status: Home / Self Care | Payer: Medicare Other | Source: Ambulatory Visit

## 2023-10-26 DIAGNOSIS — Z1231 Encounter for screening mammogram for malignant neoplasm of breast: Secondary | ICD-10-CM | POA: Diagnosis not present

## 2023-11-06 ENCOUNTER — Encounter: Payer: Self-pay | Admitting: Internal Medicine

## 2023-11-06 LAB — HM MAMMOGRAPHY

## 2023-12-05 ENCOUNTER — Emergency Department (HOSPITAL_COMMUNITY)
Admission: EM | Admit: 2023-12-05 | Discharge: 2023-12-06 | Disposition: A | Discharge status: Home / Self Care | Payer: Medicare Other | Attending: Emergency Medicine | Admitting: Emergency Medicine

## 2023-12-05 ENCOUNTER — Other Ambulatory Visit: Payer: Self-pay

## 2023-12-05 ENCOUNTER — Emergency Department (HOSPITAL_COMMUNITY): Payer: Medicare Other

## 2023-12-05 DIAGNOSIS — R509 Fever, unspecified: Secondary | ICD-10-CM | POA: Diagnosis not present

## 2023-12-05 DIAGNOSIS — I10 Essential (primary) hypertension: Secondary | ICD-10-CM | POA: Diagnosis not present

## 2023-12-05 DIAGNOSIS — Z20822 Contact with and (suspected) exposure to covid-19: Secondary | ICD-10-CM | POA: Insufficient documentation

## 2023-12-05 DIAGNOSIS — R251 Tremor, unspecified: Secondary | ICD-10-CM | POA: Insufficient documentation

## 2023-12-05 DIAGNOSIS — R1110 Vomiting, unspecified: Secondary | ICD-10-CM | POA: Insufficient documentation

## 2023-12-05 DIAGNOSIS — E860 Dehydration: Secondary | ICD-10-CM | POA: Diagnosis not present

## 2023-12-05 DIAGNOSIS — R9431 Abnormal electrocardiogram [ECG] [EKG]: Secondary | ICD-10-CM | POA: Diagnosis not present

## 2023-12-05 DIAGNOSIS — R1111 Vomiting without nausea: Secondary | ICD-10-CM | POA: Diagnosis not present

## 2023-12-05 DIAGNOSIS — R110 Nausea: Secondary | ICD-10-CM | POA: Diagnosis not present

## 2023-12-05 DIAGNOSIS — I517 Cardiomegaly: Secondary | ICD-10-CM | POA: Diagnosis not present

## 2023-12-05 DIAGNOSIS — R42 Dizziness and giddiness: Secondary | ICD-10-CM | POA: Diagnosis not present

## 2023-12-05 DIAGNOSIS — R059 Cough, unspecified: Secondary | ICD-10-CM | POA: Diagnosis not present

## 2023-12-05 LAB — CBC WITH DIFFERENTIAL/PLATELET
Abs Immature Granulocytes: 0.17 10*3/uL — ABNORMAL HIGH (ref 0.00–0.07)
Basophils Absolute: 0 10*3/uL (ref 0.0–0.1)
Basophils Relative: 0 %
Eosinophils Absolute: 0 10*3/uL (ref 0.0–0.5)
Eosinophils Relative: 0 %
HCT: 47.3 % — ABNORMAL HIGH (ref 36.0–46.0)
Hemoglobin: 15.5 g/dL — ABNORMAL HIGH (ref 12.0–15.0)
Immature Granulocytes: 2 %
Lymphocytes Relative: 5 %
Lymphs Abs: 0.5 10*3/uL — ABNORMAL LOW (ref 0.7–4.0)
MCH: 30.2 pg (ref 26.0–34.0)
MCHC: 32.8 g/dL (ref 30.0–36.0)
MCV: 92 fL (ref 80.0–100.0)
Monocytes Absolute: 0.1 10*3/uL (ref 0.1–1.0)
Monocytes Relative: 1 %
Neutro Abs: 10.9 10*3/uL — ABNORMAL HIGH (ref 1.7–7.7)
Neutrophils Relative %: 92 %
Platelets: 109 10*3/uL — ABNORMAL LOW (ref 150–400)
RBC: 5.14 MIL/uL — ABNORMAL HIGH (ref 3.87–5.11)
RDW: 13.6 % (ref 11.5–15.5)
WBC: 11.7 10*3/uL — ABNORMAL HIGH (ref 4.0–10.5)
nRBC: 0 % (ref 0.0–0.2)

## 2023-12-05 LAB — COMPREHENSIVE METABOLIC PANEL
ALT: 14 U/L (ref 0–44)
AST: 24 U/L (ref 15–41)
Albumin: 3.8 g/dL (ref 3.5–5.0)
Alkaline Phosphatase: 74 U/L (ref 38–126)
Anion gap: 10 (ref 5–15)
BUN: 11 mg/dL (ref 8–23)
CO2: 25 mmol/L (ref 22–32)
Calcium: 9.3 mg/dL (ref 8.9–10.3)
Chloride: 107 mmol/L (ref 98–111)
Creatinine, Ser: 1.14 mg/dL — ABNORMAL HIGH (ref 0.44–1.00)
GFR, Estimated: 51 mL/min — ABNORMAL LOW (ref >60)
Glucose, Bld: 139 mg/dL — ABNORMAL HIGH (ref 70–99)
Potassium: 3.6 mmol/L (ref 3.5–5.1)
Sodium: 142 mmol/L (ref 135–145)
Total Bilirubin: 1.2 mg/dL (ref 0.0–1.2)
Total Protein: 7.4 g/dL (ref 6.5–8.1)

## 2023-12-05 LAB — RESP PANEL BY RT-PCR (RSV, FLU A&B, COVID)  RVPGX2
Influenza A by PCR: NEGATIVE
Influenza B by PCR: NEGATIVE
Resp Syncytial Virus by PCR: NEGATIVE
SARS Coronavirus 2 by RT PCR: NEGATIVE

## 2023-12-05 LAB — CBG MONITORING, ED: Glucose-Capillary: 123 mg/dL — ABNORMAL HIGH (ref 70–99)

## 2023-12-05 NOTE — ED Provider Notes (Signed)
[facility] Emergency Department Provider Note MRN:  [id number]  Arrival date & time: 12/06/23     Chief Complaint   Tremors and generalized weakness  History of Present Illness   Analyssa Cardenas is a 73 y.o. year-old female presents to the ED with chief complaint of shaking.  States that she was having tremors earlier today.  Reports that she had a cough and subjective fever earlier in the week.  States that today she had vomiting.  She denies any associated diarrhea.  She denies known sick contacts.  She states that she is feeling improved now.  History provided by patient.   Review of Systems  Pertinent positive and negative review of systems noted in HPI.    Physical Exam   Vitals:   12/06/23 0200 12/06/23 0251  BP: (!) 107/54   Pulse: 76   Resp: (!) 21   Temp:  98.4 F (36.9 C)  SpO2: 96%     CONSTITUTIONAL:  non toxic-appearing, NAD NEURO:  Alert and oriented x 3, CN 3-12 grossly intact EYES:  eyes equal and reactive ENT/NECK:  Supple, no stridor  CARDIO:  normal rate, regular rhythm, appears well-perfused  PULM:  No respiratory distress, CTAB GI/GU:  non-distended, no focal abdominal tenderness MSK/SPINE:  No gross deformities, no edema, moves all extremities  SKIN:  no rash, atraumatic   *Additional and/or pertinent findings included in MDM below  Diagnostic and Interventional Summary    EKG Interpretation Date/Time:    Ventricular Rate:    PR Interval:    QRS Duration:    QT Interval:    QTC Calculation:   R Axis:      Text Interpretation:         Labs Reviewed  CBC WITH DIFFERENTIAL/PLATELET - Abnormal; Notable for the following components:      Result Value   WBC 11.7 (*)    RBC 5.14 (*)    Hemoglobin 15.5 (*)    HCT 47.3 (*)    Platelets 109 (*)    Neutro Abs 10.9 (*)    Lymphs Abs 0.5 (*)    Abs Immature Granulocytes 0.17 (*)    All other components within normal limits  COMPREHENSIVE METABOLIC PANEL -  Abnormal; Notable for the following components:   Glucose, Bld 139 (*)    Creatinine, Ser 1.14 (*)    GFR, Estimated 51 (*)    All other components within normal limits  CBG MONITORING, ED - Abnormal; Notable for the following components:   Glucose-Capillary 123 (*)    All other components within normal limits  RESP PANEL BY RT-PCR (RSV, FLU A&B, COVID)  [id number]  URINALYSIS, ROUTINE W REFLEX MICROSCOPIC  TROPONIN I (HIGH SENSITIVITY)  TROPONIN I (HIGH SENSITIVITY)    DG Chest Port 1 View  Final Result      Medications - No data to display   Procedures  /  Critical Care Procedures  ED Course and Medical Decision Making  I have reviewed the triage vital signs, the nursing notes, and pertinent available records from the EMR.  Social Determinants Affecting Complexity of Care: Patient has no clinically significant social determinants affecting this chief complaint..   ED Course: Clinical Course as of 12/06/23 0552  Wed Dec 06, 2023  0541 Resp panel by RT-PCR (RSV, Flu A&B, Covid) Anterior Nasal Swab Negative, no fevers [RB]  0542 CBG monitoring, ED(!) unremarkableCBG [RB]  0542 Comprehensive metabolic panel(!) Mildly elevated creatinine, recommend oral rehydration [RB]  0542 CBC  with Differential(!) Appears heme concentrated.  Advised patient to push fluids at home.  She states that she really doesn't eat or drink that much. [RB]  0543 DG Chest Port 1 View No obvious opacity [RB]  0543 EKG 12-Lead Picture put in chart, abnormal, but no chest pain.  Trops are flat at 11 and 13.  Doubt ACS.  Recommend outpatient follow-up. [RB]    Clinical Course User Index [RB] Kaylee Horseman, PA-C    Medical Decision Making Patient here with episode of vomiting and shaking that occurred earlier today.  She feels better now.  No diarrhea.    Will check labs and imaging.  Labs are notable for mild dehydration.   She is no longer vomiting.  Otherwise reassuring workup.  Recommend close  follow-up with PCP.  Vomiting might be 2/2 norovirus, which is very prevalent in the community right now. She doesn't have any focal abdominal tenderness.  Doubt surgical or acute abdomen.  Amount and/or Complexity of Data Reviewed Labs: ordered. Decision-making details documented in ED Course. Radiology: ordered and independent interpretation performed. Decision-making details documented in ED Course. ECG/medicine tests: ordered and independent interpretation performed. Decision-making details documented in ED Course.  Risk Prescription drug management.         Consultants: No consultations were needed in caring for this patient.   Treatment and Plan: I considered admission due to patient's initial presentation, but after considering the examination and diagnostic results, patient will not require admission and can be discharged with outpatient follow-up.    Final Clinical Impressions(s) / ED Diagnoses     ICD-10-CM   1. Vomiting, unspecified vomiting type, unspecified whether nausea present  R11.10     2. Shaking  R25.1     3. Dehydration  E86.0     4. Abnormal EKG  R94.31       ED Discharge Orders          Ordered    ondansetron (ZOFRAN-ODT) 4 MG disintegrating tablet  Every 8 hours PRN        12/06/23 0548              Discharge Instructions Discussed with and Provided to Patient:   Discharge Instructions   None      Kaylee Horseman, PA-C 12/06/23 0552    Anders Simmonds T, DO 12/07/23 1148

## 2023-12-05 NOTE — ED Triage Notes (Addendum)
Bibgcems from home pt c/o of feeling ill since 8 am today. Clear mucous vomiting and nauseous. No pain, dizzy or headache. 1000mg  tylenol with ems.  Bp- 190/100 Hr 82 98 %  Cbg 108

## 2023-12-06 LAB — TROPONIN I (HIGH SENSITIVITY)
Troponin I (High Sensitivity): 11 ng/L (ref <18)
Troponin I (High Sensitivity): 13 ng/L (ref <18)

## 2023-12-06 MED ORDER — ONDANSETRON 4 MG PO TBDP: 4.0000 mg | ORAL_TABLET | Freq: Three times a day (TID) | ORAL | 0 refills | Status: DC | PRN

## 2023-12-06 NOTE — ED Notes (Signed)
Pt ambulated ion hall with no issues

## 2023-12-12 ENCOUNTER — Telehealth (INDEPENDENT_AMBULATORY_CARE_PROVIDER_SITE_OTHER): Payer: Medicare Other | Admitting: Internal Medicine

## 2023-12-12 ENCOUNTER — Encounter: Payer: Self-pay | Admitting: Internal Medicine

## 2023-12-12 DIAGNOSIS — R9431 Abnormal electrocardiogram [ECG] [EKG]: Secondary | ICD-10-CM | POA: Diagnosis not present

## 2023-12-12 NOTE — Assessment & Plan Note (Addendum)
 QTc severely prolonged at recent ER visit and zofran  was prescribed which she did not take. She was advised it is not safe to take so to not take. Removed from list so not inadvertently refilled. She understood. She needs repeat EKG as she is not on any QTc prolonging agents and did not have low K during ER visit. She could not come today so will do Thursday morning (soonest she could come). If she prolonged will need further assessment (including more labs and likely cardiology referral) She is having some palpitations which could be related to this. Addendum: EKG done on 12/14/23 and Qtc back to normal at 454 so was likely related to illness and vomiting even though potassium levels normal. No further assessment of this is needed.

## 2023-12-12 NOTE — Progress Notes (Addendum)
 Virtual Visit via Video Note  I connected with Kaylee Cardenas on 12/12/23 at  2:40 PM EST by a video enabled telemedicine application and verified that I am speaking with the correct person using two identifiers.  The patient and the provider were at separate locations throughout the entire encounter. Patient location: home, Provider location: work   I discussed the limitations of evaluation and management by telemedicine and the availability of in person appointments. The patient expressed understanding and agreed to proceed. The patient and the provider were the only parties present for the visit unless noted in HPI below.  History of Present Illness: The patient is a 73 y.o. female with visit for vomiting and feeling poorly went to ER and got fluids and labs and EKG. They prescribed zofran  despite Qtc 597 (prior 441 in 2021). Did pass out at home with dehydration before arrival to the ER. She is unsure if ER knew about this she states EMS was there while this happened. Has been having some palpitations and some pain in the stomach since ER. Vomiting resolved. She did not take zofran  due to resolution of nausea. She is trying to eat better and drink fluids. No more syncope since ER visit.   Observations/Objective: Appearance: normal, breathing appears normal, casual grooming, mental status A and O times 3  Assessment and Plan: See problem oriented charting  Follow Up Instructions: STOP zofran . Needs EKG and due to virtual this cannot be done during visit. She is not able to come to office until Thursday this week. Depending on EKG may need labs and further assessment or cardiology referral.  Addendum: EKG done on 12/14/23 and EKG: Rate 60, axis normal, interval normalized with QTc 454, p axis variable but low voltage overall, sinus, no st or t wave changes, Qtc improved from prior   Visit time 20 minutes in face to face communication with patient and coordination of care, additional 10 minutes  spent in record review, coordination or care, ordering tests, communicating/referring to other healthcare professionals, documenting in medical records all on the same day of the visit for total time 30 minutes spent on the visit.    I discussed the assessment and treatment plan with the patient. The patient was provided an opportunity to ask questions and all were answered. The patient agreed with the plan and demonstrated an understanding of the instructions.   The patient was advised to call back or seek an in-person evaluation if the symptoms worsen or if the condition fails to improve as anticipated.  Kaylee DELENA [place], MD

## 2023-12-14 ENCOUNTER — Telehealth: Payer: Self-pay

## 2023-12-14 DIAGNOSIS — R9431 Abnormal electrocardiogram [ECG] [EKG]: Secondary | ICD-10-CM | POA: Diagnosis not present

## 2023-12-14 NOTE — Telephone Encounter (Signed)
 I called patient twice this morning giving her a curtsey call for to come in to have a EKG done recommended by her doctor. I left two voicemail's for patient to give us  a call back due to her coming in today to come in to have her EKG done.

## 2024-01-17 ENCOUNTER — Telehealth: Payer: Self-pay | Admitting: Internal Medicine

## 2024-01-17 NOTE — Telephone Encounter (Signed)
 Copied from CRM [id number]. Topic: General - Billing Inquiry >> Jan 17, 2024  3:03 PM Almira Coaster wrote: Reason for CRM: Patient is calling because she received a bill regarding an appointment on 12/12/2023. Patient states she did not have an appointment on that date she only received a call from Dr.Crawford to come and redo her EKG on 12/14/2023. I advised that on her previous appointments it showed a virtual visit but she states that it wasn't a virtual appointment it was just a phone call she received.

## 2024-01-19 NOTE — Telephone Encounter (Signed)
 This was indeed a virtual ER follow up. She was requested to come do an EKG as hers was markedly abnormal at the ER.

## 2024-01-19 NOTE — Telephone Encounter (Signed)
 Patient returned Kaylee Cardenas's call and would like a call back at [phone].

## 2024-01-19 NOTE — Telephone Encounter (Signed)
 Yes it was a video visit I saw her in her home during the visit

## 2024-08-26 ENCOUNTER — Ambulatory Visit: Admitting: Internal Medicine

## 2024-08-26 ENCOUNTER — Encounter: Payer: Self-pay | Admitting: Internal Medicine

## 2024-08-26 ENCOUNTER — Ambulatory Visit

## 2024-08-26 VITALS — BP 134/78 | HR 60 | Temp 97.6°F | Ht 65.0 in | Wt 160.0 lb

## 2024-08-26 VITALS — BP 152/78 | HR 60 | Ht 65.0 in | Wt 160.0 lb

## 2024-08-26 DIAGNOSIS — Z0000 Encounter for general adult medical examination without abnormal findings: Secondary | ICD-10-CM | POA: Diagnosis not present

## 2024-08-26 DIAGNOSIS — K5730 Diverticulosis of large intestine without perforation or abscess without bleeding: Secondary | ICD-10-CM | POA: Insufficient documentation

## 2024-08-26 DIAGNOSIS — E785 Hyperlipidemia, unspecified: Secondary | ICD-10-CM | POA: Diagnosis not present

## 2024-08-26 DIAGNOSIS — Z860100 Personal history of colon polyps, unspecified: Secondary | ICD-10-CM | POA: Insufficient documentation

## 2024-08-26 DIAGNOSIS — M8580 Other specified disorders of bone density and structure, unspecified site: Secondary | ICD-10-CM

## 2024-08-26 DIAGNOSIS — Z23 Encounter for immunization: Secondary | ICD-10-CM | POA: Diagnosis not present

## 2024-08-26 LAB — COMPREHENSIVE METABOLIC PANEL WITH GFR
ALT: 9 U/L (ref 0–35)
AST: 13 U/L (ref 0–37)
Albumin: 4 g/dL (ref 3.5–5.2)
Alkaline Phosphatase: 69 U/L (ref 39–117)
BUN: 12 mg/dL (ref 6–23)
CO2: 31 meq/L (ref 19–32)
Calcium: 9.2 mg/dL (ref 8.4–10.5)
Chloride: 106 meq/L (ref 96–112)
Creatinine, Ser: 0.95 mg/dL (ref 0.40–1.20)
GFR: 58.92 mL/min — ABNORMAL LOW (ref >60.00)
Glucose, Bld: 81 mg/dL (ref 70–99)
Potassium: 3.9 meq/L (ref 3.5–5.1)
Sodium: 144 meq/L (ref 135–145)
Total Bilirubin: 0.4 mg/dL (ref 0.2–1.2)
Total Protein: 6.9 g/dL (ref 6.0–8.3)

## 2024-08-26 LAB — LIPID PANEL
Cholesterol: 212 mg/dL — ABNORMAL HIGH (ref 0–200)
HDL: 84.3 mg/dL (ref >39.00)
LDL Cholesterol: 109 mg/dL — ABNORMAL HIGH (ref 0–99)
NonHDL: 127.39
Total CHOL/HDL Ratio: 3
Triglycerides: 94 mg/dL (ref 0.0–149.0)
VLDL: 18.8 mg/dL (ref 0.0–40.0)

## 2024-08-26 LAB — CBC
HCT: 43.3 % (ref 36.0–46.0)
Hemoglobin: 14 g/dL (ref 12.0–15.0)
MCHC: 32.3 g/dL (ref 30.0–36.0)
MCV: 90.3 fl (ref 78.0–100.0)
Platelets: 163 K/uL (ref 150.0–400.0)
RBC: 4.79 Mil/uL (ref 3.87–5.11)
RDW: 14.7 % (ref 11.5–15.5)
WBC: 5 K/uL (ref 4.0–10.5)

## 2024-08-26 LAB — VITAMIN D 25 HYDROXY (VIT D DEFICIENCY, FRACTURES): VITD: 100.9 ng/mL — ABNORMAL HIGH (ref 30.00–100.00)

## 2024-08-26 NOTE — Patient Instructions (Signed)
 Kaylee Cardenas,  Thank you for taking the time for your Medicare Wellness Visit. I appreciate your continued commitment to your health goals. Please review the care plan we discussed, and feel free to reach out if I can assist you further.  Medicare recommends these wellness visits once per year to help you and your care team stay ahead of potential health issues. These visits are designed to focus on prevention, allowing your provider to concentrate on managing your acute and chronic conditions during your regular appointments.  Please note that Annual Wellness Visits do not include a physical exam. Some assessments may be limited, especially if the visit was conducted virtually. If needed, we may recommend a separate in-person follow-up with your provider.  Ongoing Care Seeing your primary care provider every 3 to 6 months helps us  monitor your health and provide consistent, personalized care.   Referrals If a referral was made during today's visit and you haven't received any updates within two weeks, please contact the referred provider directly to check on the status.  Recommended Screenings:  Health Maintenance  Topic Date Due   Zoster (Shingles) Vaccine (2 of 2) 09/06/2020   Flu Shot  06/07/2024   COVID-19 Vaccine (4 - 2025-26 season) 07/08/2024   DTaP/Tdap/Td vaccine (2 - Td or Tdap) 08/21/2025   Medicare Annual Wellness Visit  08/26/2025   Breast Cancer Screening  11/05/2025   Colon Cancer Screening  08/31/2027   Pneumococcal Vaccine for age over 68  Completed   DEXA scan (bone density measurement)  Completed   Hepatitis C Screening  Completed   Meningitis B Vaccine  Aged Out       12/05/2023   10:22 PM  Advanced Directives  Does Patient Have a Medical Advance Directive? No;Yes  Type of Advance Directive Living will   Advance Care Planning is important because it: Ensures you receive medical care that aligns with your values, goals, and preferences. Provides guidance to  your family and loved ones, reducing the emotional burden of decision-making during critical moments.  Vision: Annual vision screenings are recommended for early detection of glaucoma, cataracts, and diabetic retinopathy. These exams can also reveal signs of chronic conditions such as diabetes and high blood pressure.  Dental: Annual dental screenings help detect early signs of oral cancer, gum disease, and other conditions linked to overall health, including heart disease and diabetes.

## 2024-08-26 NOTE — Patient Instructions (Signed)
 You can get the second dose of the shingrix (shingles vaccine)

## 2024-08-26 NOTE — Progress Notes (Signed)
 Subjective:   Kaylee Cardenas is a 73 y.o. who presents for a Medicare Wellness preventive visit.  As a reminder, Annual Wellness Visits don't include a physical exam, and some assessments may be limited, especially if this visit is performed virtually. We may recommend an in-person follow-up visit with your provider if needed.  Visit Complete: In person  Persons Participating in Visit: Patient.  AWV Questionnaire: Yes: Patient Medicare AWV questionnaire was completed by the patient on 08/22/2024; I have confirmed that all information answered by patient is correct and no changes since this date.  Cardiac Risk Factors include: advanced age (>32men, >35 women);dyslipidemia     Objective:    Today's Vitals   08/26/24 1336 08/26/24 1346  BP: (!) 152/82 (!) 152/78  Pulse: 60   SpO2: 96%   Weight: 160 lb (72.6 kg)   Height: 5' 5 (1.651 m)    Body mass index is 26.63 kg/m.     08/26/2024    1:36 PM 12/05/2023   10:22 PM 12/16/2021    2:09 PM 01/12/2021    2:42 PM 12/15/2020    3:32 PM  Advanced Directives  Does Patient Have a Medical Advance Directive? Yes No;Yes Yes Yes Yes  Type of Estate agent of [place];Living will Living will Living will;Healthcare Power of [employer] Power of [place];Living will Living will;Healthcare Power of Attorney  Does patient want to make changes to medical advance directive?   No - Patient declined  No - Patient declined  Copy of Healthcare Power of Attorney in Chart? No - copy requested  No - copy requested  No - copy requested    Current Medications (verified) Outpatient Encounter Medications as of 08/26/2024  Medication Sig   Bioflavonoid Products (ESTER C PO) Take by mouth.   Biotin 5000 MCG CAPS Take by mouth.   Calcium Carbonate-Vit D-Min (CALCIUM 1200 PO) Take 1 tablet by mouth daily.   cyanocobalamin 100 MCG tablet Take 100 mcg by mouth daily.   fish oil-omega-3 fatty acids 1000 MG capsule Take 2 g by  mouth daily.   Lactobacillus (PROBIOTIC ACIDOPHILUS PO) Take by mouth.   Multiple Vitamins-Minerals (MULTIVITAMIN WITH MINERALS) tablet Take 1 tablet by mouth daily.   No facility-administered encounter medications on file as of 08/26/2024.    Allergies (verified) Amlodipine    History: Past Medical History:  Diagnosis Date   Abnormal mammogram    Colon polyps    Dizzy spells    Headache, migraine    Kidney stones    Migraine    Osteopenia    Osteoporosis    Past Surgical History:  Procedure Laterality Date   ABDOMINAL HYSTERECTOMY     Family History  Problem Relation Age of Onset   Arthritis Mother    Cancer Mother        colon   Kidney disease Mother    Diabetes Mother    Diabetes Sister    Hypertension Sister    Breast cancer Sister 69   Kidney disease Brother    Diabetes Brother    Hypertension Brother    Mental illness Brother    Cancer Son        prostate   Social History   Socioeconomic History   Marital status: Single    Spouse name: Not on file   Number of children: Not on file   Years of education: Not on file   Highest education level: Not on file  Occupational History   Not on file  Tobacco Use   Smoking status: Never   Smokeless tobacco: Never  Substance and Sexual Activity   Alcohol use: No   Drug use: Not on file   Sexual activity: Yes  Other Topics Concern   Not on file  Social History Narrative   Exercise: occasional fund raising walks, yard work   Social Drivers of Corporate investment banker Strain: Low Risk  (08/26/2024)   Overall Financial Resource Strain (CARDIA)    Difficulty of Paying Living Expenses: Not very hard  Food Insecurity: Food Insecurity Present (08/26/2024)   Hunger Vital Sign    Worried About Running Out of Food in the Last Year: Sometimes true    Ran Out of Food in the Last Year: Sometimes true  Transportation Needs: No Transportation Needs (08/26/2024)   PRAPARE - Administrator, Civil Service  (Medical): No    Lack of Transportation (Non-Medical): No  Physical Activity: Sufficiently Active (08/26/2024)   Exercise Vital Sign    Days of Exercise per Week: 5 days    Minutes of Exercise per Session: 30 min  Stress: No Stress Concern Present (08/26/2024)   [employer] of Occupational Health - Occupational Stress Questionnaire    Feeling of Stress: Not at all  Social Connections: Moderately Integrated (08/26/2024)   Social Connection and Isolation Panel    Frequency of Communication with Friends and Family: More than three times a week    Frequency of Social Gatherings with Friends and Family: More than three times a week    Attends Religious Services: More than 4 times per year    Active Member of [employer] or Organizations: Yes    Attends Engineer, structural: More than 4 times per year    Marital Status: Never married    Tobacco Counseling Counseling given: Not Answered    Clinical Intake:  Pre-visit preparation completed: Yes  Pain : No/denies pain     BMI - recorded: 26.63 Nutritional Status: BMI 25 -29 Overweight Nutritional Risks: None Diabetes: No  Lab Results  Component Value Date   HGBA1C 5.6 09/05/2022   HGBA1C 5.5 08/23/2021   HGBA1C 5.5 10/14/2020     How often do you need to have someone help you when you read instructions, pamphlets, or other written materials from your doctor or pharmacy?: 1 - Never  Interpreter Needed?: No  Information entered by :: Verdie Saba, CMA   Activities of Daily Living     08/26/2024    1:38 PM 08/22/2024    4:39 PM  In your present state of health, do you have any difficulty performing the following activities:  Hearing? 0 0  Vision? 0 0  Difficulty concentrating or making decisions? 0 0  Walking or climbing stairs? 0 0  Dressing or bathing? 0 0  Doing errands, shopping? 0 0  Preparing Food and eating ? N N  Using the Toilet? N N  In the past six months, have you accidently leaked urine? N N   Do you have problems with loss of bowel control? N N  Managing your Medications? N N  Managing your Finances? N N  Housekeeping or managing your Housekeeping? N N    Patient Care Team: Rollene Almarie LABOR, MD as PCP - General (Internal Medicine) Roz Anes, MD as Consulting Physician (Ophthalmology)  I have updated your Care Teams any recent Medical Services you may have received from other providers in the past year.     Assessment:   This is  a routine wellness examination for [facility].  Hearing/Vision screen Hearing Screening - Comments:: Denies hearing difficulties   Vision Screening - Comments:: Wears rx glasses - up to date with routine eye exams    Goals Addressed               This Visit's Progress     Patient Stated (pt-stated)        Patient stated she plans to continue managing your bp readings & continuing school online - Accounting degree       Depression Screen     08/26/2024    1:40 PM 12/12/2023    2:33 PM 09/05/2022   11:02 AM 12/16/2021    2:08 PM 12/15/2020    3:30 PM 10/14/2020    8:56 AM 05/07/2019    9:44 AM  PHQ 2/9 Scores  PHQ - 2 Score 0 0 0 0 0 0 0  PHQ- 9 Score 0 0 0        Fall Risk     08/26/2024    1:39 PM 08/22/2024    4:39 PM 12/12/2023    2:33 PM 08/22/2023    1:18 PM 09/05/2022   11:02 AM  Fall Risk   Falls in the past year? 0 1 0 0 0  Comment confirmed w/pt - No Falls      Number falls in past yr: 0  0 0 0  Injury with Fall? 0  0 0 0  Risk for fall due to : No Fall Risks      Follow up Falls evaluation completed;Falls prevention discussed  Falls evaluation completed Falls evaluation completed     MEDICARE RISK AT HOME:  Medicare Risk at Home Any stairs in or around the home?: No If so, are there any without handrails?: No Home free of loose throw rugs in walkways, pet beds, electrical cords, etc?: Yes Adequate lighting in your home to reduce risk of falls?: Yes Life alert?: No Use of a cane, walker or w/c?: No Grab  bars in the bathroom?: No Shower chair or bench in shower?: No Elevated toilet seat or a handicapped toilet?: No  TIMED UP AND GO:  Was the test performed?  No  Cognitive Function: 6CIT completed        08/26/2024    1:43 PM  6CIT Screen  What Year? 0 points  What month? 0 points  What time? 0 points  Count back from 20 0 points  Months in reverse 0 points  Repeat phrase 2 points  Total Score 2 points    Immunizations Immunization History  Administered Date(s) Administered   Fluad Quad(high Dose 65+) 08/14/2021, 08/24/2022   Fluad Trivalent(High Dose 65+) 08/22/2023   INFLUENZA, HIGH DOSE SEASONAL PF 08/26/2024   Influenza-Unspecified 08/08/2016, 08/07/2018, 08/07/2020   PFIZER(Purple Top)SARS-COV-2 Vaccination 12/28/2019, 01/21/2020, 09/05/2020   Pneumococcal Conjugate-13 09/07/2016   Pneumococcal Polysaccharide-23 05/01/2018   Tdap 08/22/2015   Unspecified SARS-COV-2 Vaccination 08/27/2023   Zoster Recombinant(Shingrix) 07/12/2020   Zoster, Live 08/08/2016    Screening Tests Health Maintenance  Topic Date Due   Zoster Vaccines- Shingrix (2 of 2) 09/06/2020   COVID-19 Vaccine (5 - 2025-26 season) 09/11/2024 (Originally 07/08/2024)   DTaP/Tdap/Td (2 - Td or Tdap) 08/21/2025   Medicare Annual Wellness (AWV)  08/26/2025   Mammogram  11/05/2025   Colonoscopy  08/31/2027   Pneumococcal Vaccine: 50+ Years  Completed   Influenza Vaccine  Completed   DEXA SCAN  Completed   Hepatitis C Screening  Completed  Meningococcal B Vaccine  Aged Out    Health Maintenance Items Addressed:  Vaccines Given today: Influenza High-Dose  Additional Screening:  Vision Screening: Recommended annual ophthalmology exams for early detection of glaucoma and other disorders of the eye. Is the patient up to date with their annual eye exam?  Yes  Who is the provider or what is the name of the office in which the patient attends annual eye exams? Pt unable to recall the Optometrist's  name  Dental Screening: Recommended annual dental exams for proper oral hygiene  Community Resource Referral / Chronic Care Management: CRR required this visit?  No   CCM required this visit?  No   Plan:    I have personally reviewed and noted the following in the patient's chart:   Medical and social history Use of alcohol, tobacco or illicit drugs  Current medications and supplements including opioid prescriptions. Patient is not currently taking opioid prescriptions. Functional ability and status Nutritional status Physical activity Advanced directives List of other physicians Hospitalizations, surgeries, and ER visits in previous 12 months Vitals Screenings to include cognitive, depression, and falls Referrals and appointments  In addition, I have reviewed and discussed with patient certain preventive protocols, quality metrics, and best practice recommendations. A written personalized care plan for preventive services as well as general preventive health recommendations were provided to patient.   Verdie CHRISTELLA Saba, CMA   08/26/2024   After Visit Summary: (In Person-Declined) Patient declined AVS at this time.  Notes: Nothing significant to report at this time.

## 2024-08-26 NOTE — Progress Notes (Signed)
" ° °  Subjective:   Patient ID: Kaylee Cardenas, female    DOB: 06-01-1951, 73 y.o.   MRN: [id number]  The patient is here for physical. Pertinent topics discussed: Discussed the use of AI scribe software for clinical note transcription with the patient, who gave verbal consent to proceed.  History of Present Illness Kaylee Cardenas is a 73 year old female who presents for a routine follow-up visit.  PMH, [facility], social history reviewed and updated  Review of Systems  Constitutional: Negative.   HENT: Negative.    Eyes: Negative.   Respiratory:  Negative for cough, chest tightness and shortness of breath.   Cardiovascular:  Negative for chest pain, palpitations and leg swelling.  Gastrointestinal:  Negative for abdominal distention, abdominal pain, constipation, diarrhea, nausea and vomiting.  Musculoskeletal: Negative.   Skin: Negative.   Neurological: Negative.   Psychiatric/Behavioral: Negative.      Objective:  Physical Exam Constitutional:      Appearance: She is well-developed.  HENT:     Head: Normocephalic and atraumatic.  Cardiovascular:     Rate and Rhythm: Normal rate and regular rhythm.  Pulmonary:     Effort: Pulmonary effort is normal. No respiratory distress.     Breath sounds: Normal breath sounds. No wheezing or rales.  Abdominal:     General: Bowel sounds are normal. There is no distension.     Palpations: Abdomen is soft.     Tenderness: There is no abdominal tenderness.  Musculoskeletal:     Cervical back: Normal range of motion.  Skin:    General: Skin is warm and dry.  Neurological:     Mental Status: She is alert and oriented to person, place, and time.     Coordination: Coordination normal.     Vitals:   08/26/24 1342  BP: 134/78  Pulse: 60  Temp: 97.6 F (36.4 C)  TempSrc: Temporal  SpO2: 96%  Weight: 160 lb (72.6 kg)  Height: 5' 5 (1.651 m)    Assessment & Plan:   "

## 2024-08-28 ENCOUNTER — Ambulatory Visit: Payer: Self-pay | Admitting: Internal Medicine

## 2024-08-28 NOTE — Assessment & Plan Note (Signed)
Flu shot yearly. Pneumonia complete. Shingrix counseled. Tetanus up to date. Colonoscopy up to date. Mammogram up to date, pap smear aged out and dexa up to date. Counseled about sun safety and mole surveillance. Counseled about the dangers of distracted driving. Given 10 year screening recommendations.

## 2024-08-28 NOTE — Assessment & Plan Note (Signed)
 Checking lipid panel and adjust as needed not on medications.

## 2024-08-28 NOTE — Assessment & Plan Note (Signed)
 Checking vitamin D and adjust as needed. Up to date on DEXA.

## 2024-09-30 ENCOUNTER — Other Ambulatory Visit: Payer: Self-pay | Admitting: Internal Medicine

## 2024-09-30 DIAGNOSIS — Z1231 Encounter for screening mammogram for malignant neoplasm of breast: Secondary | ICD-10-CM

## 2024-10-25 ENCOUNTER — Encounter

## 2024-10-25 DIAGNOSIS — Z1231 Encounter for screening mammogram for malignant neoplasm of breast: Secondary | ICD-10-CM

## 2024-10-28 ENCOUNTER — Inpatient Hospital Stay: Admission: RE | Admit: 2024-10-28 | Discharge: 2024-10-28

## 2024-10-28 DIAGNOSIS — Z1231 Encounter for screening mammogram for malignant neoplasm of breast: Secondary | ICD-10-CM

## 2024-11-04 ENCOUNTER — Ambulatory Visit: Payer: Self-pay | Admitting: Internal Medicine

## 2025-09-01 ENCOUNTER — Ambulatory Visit

## 2025-09-01 ENCOUNTER — Encounter: Admitting: Internal Medicine
